# Patient Record
Sex: Male | Born: 1970
Health system: Southern US, Community
[De-identification: ages and names within clinical notes are randomized; demographics above are authoritative.]

## PROBLEM LIST (undated history)

## (undated) DIAGNOSIS — I219 Acute myocardial infarction, unspecified: Secondary | ICD-10-CM

## (undated) DIAGNOSIS — Z860100 Personal history of colon polyps, unspecified: Secondary | ICD-10-CM

## (undated) DIAGNOSIS — E785 Hyperlipidemia, unspecified: Secondary | ICD-10-CM

## (undated) DIAGNOSIS — G709 Myoneural disorder, unspecified: Secondary | ICD-10-CM

## (undated) HISTORY — DX: Myoneural disorder, unspecified: G70.9

## (undated) HISTORY — DX: Hyperlipidemia, unspecified: E78.5

## (undated) HISTORY — DX: Personal history of colon polyps, unspecified: Z86.0100

## (undated) HISTORY — DX: Acute myocardial infarction, unspecified: I21.9

## (undated) HISTORY — PX: COLONOSCOPY: SHX174

## (undated) HISTORY — DX: Personal history of colonic polyps: Z86.010

---

## 2011-05-13 DIAGNOSIS — E559 Vitamin D deficiency, unspecified: Secondary | ICD-10-CM | POA: Insufficient documentation

## 2014-05-05 HISTORY — PX: CARDIAC CATHETERIZATION: SHX172

## 2017-06-30 DIAGNOSIS — K635 Polyp of colon: Secondary | ICD-10-CM | POA: Diagnosis not present

## 2017-06-30 DIAGNOSIS — Z1211 Encounter for screening for malignant neoplasm of colon: Secondary | ICD-10-CM | POA: Diagnosis not present

## 2017-06-30 DIAGNOSIS — K648 Other hemorrhoids: Secondary | ICD-10-CM | POA: Diagnosis not present

## 2017-06-30 DIAGNOSIS — Z800 Family history of malignant neoplasm of digestive organs: Secondary | ICD-10-CM | POA: Diagnosis not present

## 2017-07-07 DIAGNOSIS — K635 Polyp of colon: Secondary | ICD-10-CM | POA: Diagnosis not present

## 2017-07-07 DIAGNOSIS — Z1211 Encounter for screening for malignant neoplasm of colon: Secondary | ICD-10-CM | POA: Diagnosis not present

## 2017-11-09 DIAGNOSIS — K219 Gastro-esophageal reflux disease without esophagitis: Secondary | ICD-10-CM | POA: Diagnosis not present

## 2017-11-09 DIAGNOSIS — R1011 Right upper quadrant pain: Secondary | ICD-10-CM | POA: Diagnosis not present

## 2017-11-09 DIAGNOSIS — R1013 Epigastric pain: Secondary | ICD-10-CM | POA: Diagnosis not present

## 2017-11-10 ENCOUNTER — Other Ambulatory Visit: Payer: Self-pay | Admitting: Gastroenterology

## 2017-11-10 ENCOUNTER — Other Ambulatory Visit: Payer: Self-pay | Admitting: Internal Medicine

## 2017-11-10 DIAGNOSIS — R748 Abnormal levels of other serum enzymes: Secondary | ICD-10-CM

## 2017-11-16 ENCOUNTER — Ambulatory Visit
Admission: RE | Admit: 2017-11-16 | Discharge: 2017-11-16 | Disposition: A | Discharge status: Home / Self Care | Payer: BLUE CROSS/BLUE SHIELD | Source: Ambulatory Visit | Attending: Gastroenterology | Admitting: Gastroenterology

## 2017-11-16 DIAGNOSIS — R748 Abnormal levels of other serum enzymes: Secondary | ICD-10-CM

## 2017-11-16 DIAGNOSIS — R109 Unspecified abdominal pain: Secondary | ICD-10-CM | POA: Diagnosis not present

## 2017-11-16 DIAGNOSIS — E119 Type 2 diabetes mellitus without complications: Secondary | ICD-10-CM | POA: Diagnosis not present

## 2017-11-16 MED ORDER — IOPAMIDOL (ISOVUE-300) INJECTION 61%
100.0000 mL | Freq: Once | INTRAVENOUS | Status: AC | PRN
  Administered 2017-11-16: 100 mL via INTRAVENOUS

## 2017-12-14 DIAGNOSIS — Z8679 Personal history of other diseases of the circulatory system: Secondary | ICD-10-CM | POA: Diagnosis not present

## 2017-12-14 DIAGNOSIS — R1013 Epigastric pain: Secondary | ICD-10-CM | POA: Diagnosis not present

## 2017-12-14 DIAGNOSIS — R748 Abnormal levels of other serum enzymes: Secondary | ICD-10-CM | POA: Diagnosis not present

## 2017-12-14 DIAGNOSIS — I2510 Atherosclerotic heart disease of native coronary artery without angina pectoris: Secondary | ICD-10-CM | POA: Diagnosis not present

## 2017-12-14 DIAGNOSIS — R932 Abnormal findings on diagnostic imaging of liver and biliary tract: Secondary | ICD-10-CM | POA: Diagnosis not present

## 2017-12-16 ENCOUNTER — Other Ambulatory Visit: Payer: Self-pay | Admitting: Gastroenterology

## 2017-12-16 DIAGNOSIS — R932 Abnormal findings on diagnostic imaging of liver and biliary tract: Secondary | ICD-10-CM

## 2017-12-16 DIAGNOSIS — R748 Abnormal levels of other serum enzymes: Secondary | ICD-10-CM

## 2017-12-25 ENCOUNTER — Other Ambulatory Visit: Payer: BLUE CROSS/BLUE SHIELD

## 2018-01-26 DIAGNOSIS — Z0000 Encounter for general adult medical examination without abnormal findings: Secondary | ICD-10-CM | POA: Diagnosis not present

## 2018-05-17 DIAGNOSIS — E785 Hyperlipidemia, unspecified: Secondary | ICD-10-CM | POA: Diagnosis not present

## 2018-05-17 DIAGNOSIS — M65341 Trigger finger, right ring finger: Secondary | ICD-10-CM | POA: Diagnosis not present

## 2018-05-17 DIAGNOSIS — E119 Type 2 diabetes mellitus without complications: Secondary | ICD-10-CM | POA: Diagnosis not present

## 2018-10-25 DIAGNOSIS — Z947 Corneal transplant status: Secondary | ICD-10-CM | POA: Diagnosis not present

## 2018-10-25 DIAGNOSIS — H18612 Keratoconus, stable, left eye: Secondary | ICD-10-CM | POA: Diagnosis not present

## 2018-10-25 DIAGNOSIS — H2513 Age-related nuclear cataract, bilateral: Secondary | ICD-10-CM | POA: Diagnosis not present

## 2018-10-25 DIAGNOSIS — E119 Type 2 diabetes mellitus without complications: Secondary | ICD-10-CM | POA: Diagnosis not present

## 2018-11-17 DIAGNOSIS — R972 Elevated prostate specific antigen [PSA]: Secondary | ICD-10-CM | POA: Diagnosis not present

## 2018-11-17 DIAGNOSIS — M65341 Trigger finger, right ring finger: Secondary | ICD-10-CM | POA: Diagnosis not present

## 2018-11-17 DIAGNOSIS — E119 Type 2 diabetes mellitus without complications: Secondary | ICD-10-CM | POA: Diagnosis not present

## 2018-11-30 DIAGNOSIS — E7849 Other hyperlipidemia: Secondary | ICD-10-CM | POA: Diagnosis not present

## 2018-11-30 DIAGNOSIS — I2510 Atherosclerotic heart disease of native coronary artery without angina pectoris: Secondary | ICD-10-CM | POA: Diagnosis not present

## 2018-11-30 DIAGNOSIS — Z7982 Long term (current) use of aspirin: Secondary | ICD-10-CM | POA: Diagnosis not present

## 2018-11-30 DIAGNOSIS — Z888 Allergy status to other drugs, medicaments and biological substances status: Secondary | ICD-10-CM | POA: Diagnosis not present

## 2018-12-02 DIAGNOSIS — Z888 Allergy status to other drugs, medicaments and biological substances status: Secondary | ICD-10-CM | POA: Diagnosis not present

## 2018-12-02 DIAGNOSIS — E7849 Other hyperlipidemia: Secondary | ICD-10-CM | POA: Diagnosis not present

## 2018-12-02 DIAGNOSIS — I2510 Atherosclerotic heart disease of native coronary artery without angina pectoris: Secondary | ICD-10-CM | POA: Diagnosis not present

## 2018-12-02 DIAGNOSIS — Z7982 Long term (current) use of aspirin: Secondary | ICD-10-CM | POA: Diagnosis not present

## 2018-12-06 ENCOUNTER — Encounter: Payer: Self-pay | Admitting: Family Medicine

## 2018-12-06 ENCOUNTER — Encounter: Payer: Self-pay | Admitting: Gastroenterology

## 2018-12-06 ENCOUNTER — Ambulatory Visit (INDEPENDENT_AMBULATORY_CARE_PROVIDER_SITE_OTHER): Payer: BLUE CROSS/BLUE SHIELD | Admitting: Family Medicine

## 2018-12-06 VITALS — BP 124/80 | HR 76 | Temp 98.2°F | Ht 64.0 in | Wt 165.0 lb

## 2018-12-06 DIAGNOSIS — F5231 Female orgasmic disorder: Secondary | ICD-10-CM | POA: Insufficient documentation

## 2018-12-06 DIAGNOSIS — M65341 Trigger finger, right ring finger: Secondary | ICD-10-CM

## 2018-12-06 DIAGNOSIS — Z125 Encounter for screening for malignant neoplasm of prostate: Secondary | ICD-10-CM | POA: Insufficient documentation

## 2018-12-06 DIAGNOSIS — K635 Polyp of colon: Secondary | ICD-10-CM

## 2018-12-06 DIAGNOSIS — E119 Type 2 diabetes mellitus without complications: Secondary | ICD-10-CM

## 2018-12-06 DIAGNOSIS — Z23 Encounter for immunization: Secondary | ICD-10-CM

## 2018-12-06 DIAGNOSIS — K760 Fatty (change of) liver, not elsewhere classified: Secondary | ICD-10-CM

## 2018-12-06 DIAGNOSIS — Z0000 Encounter for general adult medical examination without abnormal findings: Secondary | ICD-10-CM | POA: Diagnosis not present

## 2018-12-06 DIAGNOSIS — IMO0002 Reserved for concepts with insufficient information to code with codable children: Secondary | ICD-10-CM | POA: Insufficient documentation

## 2018-12-06 LAB — BASIC METABOLIC PANEL
BUN: 14 mg/dL (ref 6–23)
CHLORIDE: 104 meq/L (ref 96–112)
CO2: 24 mEq/L (ref 19–32)
CREATININE: 1.1 mg/dL (ref 0.40–1.50)
Calcium: 9 mg/dL (ref 8.4–10.5)
GFR: 71.14 mL/min (ref >60.00)
Glucose, Bld: 119 mg/dL — ABNORMAL HIGH (ref 70–99)
Potassium: 4.6 mEq/L (ref 3.5–5.1)
Sodium: 136 mEq/L (ref 135–145)

## 2018-12-06 NOTE — Patient Instructions (Signed)
Health Maintenance, Male A healthy lifestyle and preventive care is important for your health and wellness. Ask your health care provider about what schedule of regular examinations is right for you. What should I know about weight and diet? Eat a Healthy Diet  Eat plenty of vegetables, fruits, whole grains, low-fat dairy products, and lean protein.  Do not eat a lot of foods high in solid fats, added sugars, or salt.  Maintain a Healthy Weight Regular exercise can help you achieve or maintain a healthy weight. You should:  Do at least 150 minutes of exercise each week. The exercise should increase your heart rate and make you sweat (moderate-intensity exercise).  Do strength-training exercises at least twice a week. Watch Your Levels of Cholesterol and Blood Lipids  Have your blood tested for lipids and cholesterol every 5 years starting at 49 years of age. If you are at high risk for heart disease, you should start having your blood tested when you are 49 years old. You may need to have your cholesterol levels checked more often if: ? Your lipid or cholesterol levels are high. ? You are older than 50 years of age. ? You are at high risk for heart disease. What should I know about cancer screening? Many types of cancers can be detected early and may often be prevented. Lung Cancer  You should be screened every year for lung cancer if: ? You are a current smoker who has smoked for at least 30 years. ? You are a former smoker who has quit within the past 15 years.  Talk to your health care provider about your screening options, when you should start screening, and how often you should be screened. Colorectal Cancer  Routine colorectal cancer screening usually begins at 50 years of age and should be repeated every 5-10 years until you are 49 years old. You may need to be screened more often if early forms of precancerous polyps or small growths are found. Your health care provider may  recommend screening at an earlier age if you have risk factors for colon cancer.  Your health care provider may recommend using home test kits to check for hidden blood in the stool.  A small camera at the end of a tube can be used to examine your colon (sigmoidoscopy or colonoscopy). This checks for the earliest forms of colorectal cancer. Prostate and Testicular Cancer  Depending on your age and overall health, your health care provider may do certain tests to screen for prostate and testicular cancer.  Talk to your health care provider about any symptoms or concerns you have about testicular or prostate cancer. Skin Cancer  Check your skin from head to toe regularly.  Tell your health care provider about any new moles or changes in moles, especially if: ? There is a change in a mole's size, shape, or color. ? You have a mole that is larger than a pencil eraser.  Always use sunscreen. Apply sunscreen liberally and repeat throughout the day.  Protect yourself by wearing long sleeves, pants, a wide-brimmed hat, and sunglasses when outside. What should I know about heart disease, diabetes, and high blood pressure?  If you are 18-39 years of age, have your blood pressure checked every 3-5 years. If you are 40 years of age or older, have your blood pressure checked every year. You should have your blood pressure measured twice-once when you are at a hospital or clinic, and once when you are not at a hospital   or clinic. Record the average of the two measurements. To check your blood pressure when you are not at a hospital or clinic, you can use: ? An automated blood pressure machine at a pharmacy. ? A home blood pressure monitor.  Talk to your health care provider about your target blood pressure.  If you are between 86-4 years old, ask your health care provider if you should take aspirin to prevent heart disease.  Have regular diabetes screenings by checking your fasting blood sugar  level. ? If you are at a normal weight and have a low risk for diabetes, have this test once every three years after the age of 13. ? If you are overweight and have a high risk for diabetes, consider being tested at a younger age or more often.  A one-time screening for abdominal aortic aneurysm (AAA) by ultrasound is recommended for men aged 49-75 years who are current or former smokers. What should I know about preventing infection? Hepatitis B If you have a higher risk for hepatitis B, you should be screened for this virus. Talk with your health care provider to find out if you are at risk for hepatitis B infection. Hepatitis C Blood testing is recommended for:  Everyone born from [birth year] through 1966.  Anyone with known risk factors for hepatitis C. Sexually Transmitted Diseases (STDs)  You should be screened each year for STDs including gonorrhea and chlamydia if: ? You are sexually active and are younger than 49 years of age. ? You are older than 49 years of age and your health care provider tells you that you are at risk for this type of infection. ? Your sexual activity has changed since you were last screened and you are at an increased risk for chlamydia or gonorrhea. Ask your health care provider if you are at risk.  Talk with your health care provider about whether you are at high risk of being infected with HIV. Your health care provider may recommend a prescription medicine to help prevent HIV infection. What else can I do?  Schedule regular health, dental, and eye exams.  Stay current with your vaccines (immunizations).  Do not use any tobacco products, such as cigarettes, chewing tobacco, and e-cigarettes. If you need help quitting, ask your health care provider.  Limit alcohol intake to no more than 2 drinks per day. One drink equals 12 ounces of beer, 5 ounces of wine, or 1 ounces of hard liquor.  Do not use street drugs.  Do not share needles.  Ask your health  care provider for help if you need support or information about quitting drugs.  Tell your health care provider if you often feel depressed.  Tell your health care provider if you have ever been abused or do not feel safe at home. This information is not intended to replace advice given to you by your health care provider. Make sure you discuss any questions you have with your health care provider. Document Released: 04/03/2008 Document Revised: 06/04/2016 Document Reviewed: 07/10/2015 Elsevier Interactive Patient Education  2019 [employer] Nonalcoholic Fatty Liver Disease Diet Nonalcoholic fatty liver disease is a condition that causes fat to accumulate in and around the liver. The disease makes it harder for the liver to work the way that it should. Following a healthy diet can help to keep nonalcoholic fatty liver disease under control. It can also help to prevent or improve conditions that are associated with the disease, such as heart disease, diabetes, high blood  pressure, and abnormal cholesterol levels. Along with regular exercise, this diet:  Promotes weight loss.  Helps to control blood sugar levels.  Helps to improve the way that the body uses insulin. What do I need to know about this diet?  Use the glycemic index (GI) to plan your meals. The index tells you how quickly a food will raise your blood sugar. Choose low-GI foods. These foods take a longer time to raise blood sugar.  Keep track of how many calories you take in. Eating the right amount of calories will help you to achieve a healthy weight.  You may want to follow a Mediterranean diet. This diet includes a lot of vegetables, lean meats or fish, whole grains, fruits, and healthy oils and fats. What foods can I eat? Grains Whole grains, such as whole-wheat or whole-grain breads, crackers, tortillas, cereals, and pasta. Stone-ground whole wheat. Pumpernickel bread. Unsweetened oatmeal. Bulgur. Barley. Quinoa. Brown or  wild rice. Corn or whole-wheat flour tortillas. Vegetables Lettuce. Spinach. Peas. Beets. Cauliflower. Cabbage. Broccoli. Carrots. Tomatoes. Squash. Eggplant. Herbs. Peppers. Onions. Cucumbers. Brussels sprouts. Yams and sweet potatoes. Beans. Lentils. Fruits Bananas. Apples. Oranges. Grapes. Papaya. Mango. Pomegranate. Kiwi. Grapefruit. Cherries. Meats and Other Protein Sources Seafood and shellfish. Lean meats. Poultry. Tofu. Dairy Low-fat or fat-free dairy products, such as yogurt, cottage cheese, and cheese. Beverages Water. Sugar-free drinks. Tea. Coffee. Low-fat or skim milk. Milk alternatives, such as soy or almond milk. Real fruit juice. Condiments Mustard. Relish. Low-fat, low-sugar ketchup and barbecue sauce. Low-fat or fat-free mayonnaise. Sweets and Desserts Sugar-free sweets. Fats and Oils Avocado. Canola or olive oil. Nuts and nut butters. Seeds. The items listed above may not be a complete list of recommended foods or beverages. Contact your dietitian for more options. What foods are not recommended? Palm oil and coconut oil. Processed foods. Fried foods. Sweetened drinks, such as sweet tea, milkshakes, snow cones, iced sweet drinks, and sodas. Alcohol. Sweets. Foods that contain a lot of salt or sodium. The items listed above may not be a complete list of foods and beverages to avoid. Contact your dietitian for more information. This information is not intended to replace advice given to you by your health care provider. Make sure you discuss any questions you have with your health care provider. Document Released: 02/20/2015 Document Revised: 03/13/2016 Document Reviewed: 10/31/2014 Elsevier Interactive Patient Education  2019 [employer].

## 2018-12-06 NOTE — Progress Notes (Signed)
Established Patient Office Visit  Subjective:  Patient ID: Joseph Horton, male    DOB: 1971/05/04  Age: 48 y.o. MRN: [id number]  CC:  Chief Complaint  Patient presents with  . Establish Care    HPI Deontre Allsup presents for establishment of care for complete physical exam and follow-up of multiple medical issues.  Significant past medical history of inferior MI back in 2015.  Status post placement of 2 stents.  He is currently followed by cardiology and has been taking a PC KS 9 inhibitor.  History of diabetes.  Lab work drawn last month shows a hemoglobin A1c of 6.4.  He did see the eye doctor in January.  History of colonic polyposis was advised to follow-up for a colonoscopy this year.  History of fatty liver disease.  Check triglycerides drawn this past month were 205.  He has 1 glass of red wine nightly.  On occasion he has 2 beers in a given setting.  He is not currently exercising.  LDL from cardiology was 84 this past month.  Patient reports a decreased sensation with orgasm.  He has no difficulty with erection dysfunction.  History reviewed. No pertinent past medical history.  History reviewed. No pertinent surgical history.  History reviewed. No pertinent family history.  Social History   Socioeconomic History  . Marital status: Married    Spouse name: Not on file  . Number of children: Not on file  . Years of education: Not on file  . Highest education level: Not on file  Occupational History  . Not on file  Social Needs  . Financial resource strain: Not on file  . Food insecurity:    Worry: Not on file    Inability: Not on file  . Transportation needs:    Medical: Not on file    Non-medical: Not on file  Tobacco Use  . Smoking status: Never Smoker  . Smokeless tobacco: Never Used  Substance and Sexual Activity  . Alcohol use: Not on file  . Drug use: Not on file  . Sexual activity: Not on file  Lifestyle  . Physical activity:    Days per week: Not on file      Minutes per session: Not on file  . Stress: Not on file  Relationships  . Social connections:    Talks on phone: Not on file    Gets together: Not on file    Attends religious service: Not on file    Active member of club or organization: Not on file    Attends meetings of clubs or organizations: Not on file    Relationship status: Not on file  . Intimate partner violence:    Fear of current or ex partner: Not on file    Emotionally abused: Not on file    Physically abused: Not on file    Forced sexual activity: Not on file  Other Topics Concern  . Not on file  Social History Narrative  . Not on file    Outpatient Medications Prior to Visit  Medication Sig Dispense Refill  . Alirocumab (PRALUENT) 75 MG/ML SOAJ Inject 1 mL (75 mg total) into the skin every 14 days.    Marland Kitchen aspirin 81 MG tablet Take 1 tablet by mouth daily.    Marland Kitchen JANUMET XR 50-1000 MG TB24 Take 1 tablet by mouth daily.    Marland Kitchen lisinopril (PRINIVIL,ZESTRIL) 10 MG tablet Take 10 mg by mouth daily.    . metoprolol succinate (TOPROL-XL) 25 MG  24 hr tablet Take 1 tablet by mouth daily.    . nitroGLYCERIN (NITROSTAT) 0.4 MG SL tablet Place 1 tablet under the tongue every 5 minutes as needed for up to 30 days for chest pain.    . Omega-3 1000 MG CAPS Take 1 capsule by mouth daily.    . ranitidine (ZANTAC) 150 MG tablet Take 1 tablet by mouth daily.    . Alirocumab (PRALUENT) 150 MG/ML SOSY Inject into the skin.     No facility-administered medications prior to visit.     Allergies  Allergen Reactions  . Rosuvastatin Other (See Comments)    myalgia myalgia   . Statins Other (See Comments)    ROS Review of Systems  Constitutional: Negative for chills, fatigue, fever and unexpected weight change.  HENT: Negative.   Eyes: Negative for photophobia and visual disturbance.  Respiratory: Negative.   Cardiovascular: Negative.   Gastrointestinal: Negative.   Endocrine: Negative for polyphagia and polyuria.   Genitourinary: Negative for difficulty urinating, frequency and urgency.  Musculoskeletal: Negative for gait problem and joint swelling.  Skin: Negative for pallor and rash.  Allergic/Immunologic: Negative for immunocompromised state.  Neurological: Negative for light-headedness and headaches.  Hematological: Does not bruise/bleed easily.  Psychiatric/Behavioral: Negative.       Objective:    Physical Exam  Constitutional: He is oriented to person, place, and time. He appears well-developed and well-nourished. No distress.  HENT:  Head: Normocephalic and atraumatic.  Right Ear: External ear normal.  Left Ear: External ear normal.  Mouth/Throat: Oropharynx is clear and moist. No oropharyngeal exudate.  Eyes: Pupils are equal, round, and reactive to light. Conjunctivae are normal. Right eye exhibits no discharge. Left eye exhibits no discharge. No scleral icterus.  Neck: Neck supple. No JVD present. No tracheal deviation present. No thyromegaly present.  Cardiovascular: Normal rate, regular rhythm and normal heart sounds.  Pulmonary/Chest: Effort normal and breath sounds normal. No stridor.  Abdominal: Bowel sounds are normal. He exhibits no distension. There is no abdominal tenderness. There is no rebound and no guarding.  Genitourinary: Rectum:     Guaiac result negative.     No rectal mass, anal fissure, tenderness, external hemorrhoid, internal hemorrhoid or abnormal anal tone.  Prostate is not enlarged and not tender.  Musculoskeletal:       Arms:  Neurological: He is alert and oriented to person, place, and time.  Skin: Skin is warm and dry. He is not diaphoretic.  Psychiatric: He has a normal mood and affect. His behavior is normal.    BP 124/80   Pulse 76   Temp 98.2 F (36.8 C) (Oral)   Ht '5\' 4"'  (1.626 m)   Wt 165 lb (74.8 kg)   SpO2 98%   BMI 28.32 kg/m  Wt Readings from Last 3 Encounters:  12/06/18 165 lb (74.8 kg)   BP Readings from Last 3 Encounters:   12/06/18 124/80   Guideline developer:  UpToDate (see UpToDate for funding source) Date Released: June 2014  Health Maintenance Due  Topic Date Due  . HEMOGLOBIN A1C  06/05/1970  . FOOT EXAM  12/01/1979  . OPHTHALMOLOGY EXAM  12/01/1979  . HIV Screening  11/30/1984  . TETANUS/TDAP  11/30/1988    There are no preventive care reminders to display for this patient.  No results found for: TSH No results found for: WBC, HGB, HCT, MCV, PLT No results found for: NA, K, CHLORIDE, CO2, GLUCOSE, BUN, CREATININE, BILITOT, ALKPHOS, AST, ALT, PROT, ALBUMIN, CALCIUM, ANIONGAP, EGFR,  GFR No results found for: CHOL No results found for: HDL No results found for: LDLCALC No results found for: TRIG No results found for: CHOLHDL No results found for: HGBA1C    Assessment & Plan:   Problem List Items Addressed This Visit      Digestive   Fatty liver disease, nonalcoholic   Relevant Orders   Ambulatory referral to Gastroenterology   Polyp of colon   Relevant Orders   Ambulatory referral to Gastroenterology     Endocrine   Type 2 diabetes mellitus without complication, without long-term current use of insulin (HCC)   Relevant Medications   JANUMET XR 50-1000 MG TB24   lisinopril (PRINIVIL,ZESTRIL) 10 MG tablet   aspirin 81 MG tablet   Other Relevant Orders   Basic metabolic panel     Musculoskeletal and Integument   Trigger ring finger of right hand   Relevant Orders   Ambulatory referral to Sports Medicine     Other   Need for immunization against influenza   Relevant Orders   Flu Vaccine QUAD 36+ mos IM (Completed)   Healthcare maintenance - Primary   Orgasm dysfunction   Relevant Orders   Ambulatory referral to Urology      No orders of the defined types were placed in this encounter.   Follow-up: Return in about 3 months (around 03/06/2019).    Encourage patient to exercise and lose weight and maintain control of his diabetes is the main treatment for fatty liver  disease.  He will follow-up with gastroenterology for repeat colonoscopy and fatty liver disease.

## 2018-12-09 ENCOUNTER — Ambulatory Visit (INDEPENDENT_AMBULATORY_CARE_PROVIDER_SITE_OTHER): Payer: BLUE CROSS/BLUE SHIELD

## 2018-12-09 ENCOUNTER — Ambulatory Visit: Payer: BLUE CROSS/BLUE SHIELD | Admitting: Family Medicine

## 2018-12-09 ENCOUNTER — Encounter: Payer: Self-pay | Admitting: Family Medicine

## 2018-12-09 VITALS — BP 120/84 | HR 84 | Temp 98.6°F | Ht 64.0 in | Wt 165.0 lb

## 2018-12-09 DIAGNOSIS — M65341 Trigger finger, right ring finger: Secondary | ICD-10-CM | POA: Diagnosis not present

## 2018-12-09 NOTE — Assessment & Plan Note (Signed)
Acute on chronic and has been worsening.  Interferes with some of his hobbies. -Trigger finger injection -Counseled on buddy taping. -Counseled on supportive care. -Given indications to return

## 2018-12-09 NOTE — Patient Instructions (Signed)
Nice to meet you  Please try to keep the fingers buddy taped together at night  Please try to avoid golf until next weekend  Please see me back in 4-6 weeks if the triggering returns.

## 2018-12-09 NOTE — Progress Notes (Signed)
Tregan Read - 48 y.o. male MRN [id number]  Date of birth: Mar 04, 1971  SUBJECTIVE:  Including CC & ROS.  Chief Complaint  Patient presents with  . Hand Pain    pt here for right ring finger pain. pt sts this has been ongoing for almost a year.     Albie Arizpe is a 48 y.o. male that is presenting with triggering of the right ring finger.  This is been occurring for 8 months.  Denies any inciting event.  Feels like the pain is getting worse.  He has significant pain in the mornings or when he is gripping something.  Cannot play golf without pain.  Has tried oral medications with no relief.  No history of prior symptoms.  Localized to the right ring finger.  Symptoms are intermittent and moderate to severe.  No swelling or ecchymosis..    Review of Systems  Constitutional: Negative for fever.  HENT: Negative for congestion.   Respiratory: Negative for cough.   Cardiovascular: Negative for chest pain.  Gastrointestinal: Negative for abdominal pain.  Musculoskeletal: Negative for back pain.  Skin: Negative for color change.  Neurological: Negative for weakness.  Hematological: Negative for adenopathy.  Psychiatric/Behavioral: Negative for agitation.    HISTORY: Past Medical, Surgical, Social, and Family History Reviewed & Updated per EMR.   Pertinent Historical Findings include:  No past medical history on file.  No past surgical history on file.  Allergies  Allergen Reactions  . Rosuvastatin Other (See Comments)    myalgia myalgia   . Statins Other (See Comments)    No family history on file.   Social History   Socioeconomic History  . Marital status: Significant Other    Spouse name: Not on file  . Number of children: Not on file  . Years of education: Not on file  . Highest education level: Not on file  Occupational History  . Not on file  Social Needs  . Financial resource strain: Not on file  . Food insecurity:    Worry: Not on file    Inability: Not on  file  . Transportation needs:    Medical: Not on file    Non-medical: Not on file  Tobacco Use  . Smoking status: Never Smoker  . Smokeless tobacco: Never Used  Substance and Sexual Activity  . Alcohol use: Not on file  . Drug use: Not on file  . Sexual activity: Not on file  Lifestyle  . Physical activity:    Days per week: Not on file    Minutes per session: Not on file  . Stress: Not on file  Relationships  . Social connections:    Talks on phone: Not on file    Gets together: Not on file    Attends religious service: Not on file    Active member of club or organization: Not on file    Attends meetings of clubs or organizations: Not on file    Relationship status: Not on file  . Intimate partner violence:    Fear of current or ex partner: Not on file    Emotionally abused: Not on file    Physically abused: Not on file    Forced sexual activity: Not on file  Other Topics Concern  . Not on file  Social History Narrative  . Not on file     PHYSICAL EXAM:  VS: BP 120/84   Pulse 84   Temp 98.6 F (37 C) (Oral)   Ht 5\' 4"  (  1.626 m)   SpO2 97%   BMI 28.32 kg/m  Physical Exam Gen: NAD, alert, cooperative with exam, well-appearing ENT: normal lips, normal nasal mucosa,  Eye: normal EOM, normal conjunctiva and lids CV:  no edema, +2 pedal pulses   Resp: no accessory muscle use, non-labored,  Skin: no rashes, no areas of induration  Neuro: normal tone, normal sensation to touch Psych:  normal insight, alert and oriented MSK:  Right hand: Tenderness over the left flexor of the ring finger. Triggering evident when gripping. Normal grip strength. No swelling. Normal range of motion. Neurovascular intact   Aspiration/Injection Procedure Note Meredith Mells Nov 09, 1970  Procedure: Injection Indications: Right trigger finger of ring finger  Procedure Details Consent: Risks of procedure as well as the alternatives and risks of each were explained to the  (patient/caregiver).  Consent for procedure obtained. Time Out: Verified patient identification, verified procedure, site/side was marked, verified correct patient position, special equipment/implants available, medications/allergies/relevent history reviewed, required imaging and test results available.  Performed.  The area was cleaned with iodine and alcohol swabs.    The right ring finger trigger finger was injected using 0.5 cc's of 40 mg Kenalog and 0.5 cc's of 0.25% bupivacaine with a 25 1 1/2" needle.  Ultrasound was used. Images were obtained in long views showing the injection.     A sterile dressing was applied.  Patient did tolerate procedure well.       ASSESSMENT & PLAN:   Trigger ring finger of right hand Acute on chronic and has been worsening.  Interferes with some of his hobbies. -Trigger finger injection -Counseled on buddy taping. -Counseled on supportive care. -Given indications to return

## 2019-01-13 ENCOUNTER — Telehealth (INDEPENDENT_AMBULATORY_CARE_PROVIDER_SITE_OTHER): Payer: BLUE CROSS/BLUE SHIELD | Admitting: Gastroenterology

## 2019-01-13 ENCOUNTER — Other Ambulatory Visit: Payer: Self-pay

## 2019-01-13 DIAGNOSIS — Z86010 Personal history of colonic polyps: Secondary | ICD-10-CM

## 2019-01-13 DIAGNOSIS — R7401 Elevation of levels of liver transaminase levels: Secondary | ICD-10-CM

## 2019-01-13 DIAGNOSIS — K760 Fatty (change of) liver, not elsewhere classified: Secondary | ICD-10-CM | POA: Diagnosis not present

## 2019-01-13 DIAGNOSIS — R740 Nonspecific elevation of levels of transaminase and lactic acid dehydrogenase [LDH]: Secondary | ICD-10-CM | POA: Diagnosis not present

## 2019-01-13 DIAGNOSIS — R932 Abnormal findings on diagnostic imaging of liver and biliary tract: Secondary | ICD-10-CM

## 2019-01-13 NOTE — Progress Notes (Signed)
Chief Complaint: Elevated ALT, fatty liver, abnormal CT, history of colon polyps   Referring Provider:     Libby Maw, MD   HPI:    Due to current restrictions/limitations of in-office visits due to the COVID-19 pandemic, this scheduled clinical appointment was converted to a telehealth virtual consultation.  -Time of medical discussion: 23 minutes -The patient did consent to this virtual visit and is aware of possible charges through their insurance for this visit.  -Names of all parties present: Joseph Horton (patient), Joseph Heck, DO, [facility] (physician) -Patient location: Home -Physician location: Office  Joseph Horton is a 48 y.o. male with a hx of CAD w/ MI in 2015 s/p PCI w/ 2 stents placed in right coronary artery, DM (A1c 6.4), HLD and hx of colon polyps referred to the [facility] for evaluation of fatty liver disease and ongoing polyp surveillance.  Last colonoscopy was in 2018 with [facility] which was notable for polyps, with recommendation to repeat in 2 years (unclear reason; requesting records today).  Otherwise, he is without lower GI symptoms to include no hematochezia, change in bowel habits, abdominal pain.  Tolerating all p.o. intake.  Weight is stable.  Unclear why he had early CRC screening, but he cites some issue with his mother, but not colon cancer or colon polyps.  Additionally, he states he has a history of fatty liver disease.  Was previously evaluated by [facility] for this issue in 2019 to include a CT as outlined below.  Very mildly elevated liver enzymes in 2018.  He states these were repeated last year and improved.  Requesting those records today. Was previously drinking approx 10 drinks/week (wine/beer), but stopped drinking since being told about the liver issues.  BMI currently 29. Started exercising 2 weeks ago and started new, healthy diet per Dr. Bebe Shaggy recommendations.   No recent liver enzymes for review.  Most  recent study was from 04/2017 with AST/ALT 33/45, ALP 79, T bili 0.8 with normal albumin.  Normal BMP 11/2018.  No recent CBC.  CT abdomen (10/2017) with ?fine liver surface irregularity and slight hypertrophy of lateral left lobe, unable to exclude early cirrhosis.  0.5 cm hypodensity in the inferior right liver lobe.  Normal ducts, normal pancreas, spleen.  Radiologist recommended hepatic elastography along with MRI three-phase in 3 to 6 months for the small hypodensity.  Interestingly, he even sought a 2nd opinion with another GI in [place] (where he is from) in 2019, to include MRI liver, which she reports looked normal.  This report was sent to his physician at [place].  Otherwise, no known personal or family history of liver or pancreatic disease.  He denies any history of ascites, jaundice, encephalopathy, GI bleed.  No previous liver biopsy.  Unclear what additional serologic evaluation has been done in the past.  Past medical history, past surgical history, social history, family history, medications, and allergies reviewed in the chart and with patient over the WebEx.   PMHx: CAD with MI in 2015, PCI with 2 stents placed to right coronary artery-2015 Diabetes Hyperlipidemia Fatty liver disease History of colon polyps  PSHx: Cardiac catheterization Coronary angioplasty  FHx: No known family history of CRC, GI malignancy, liver disease, pancreatic disease, or IBD.   Social History   Tobacco Use  . Smoking status: Never Smoker  . Smokeless tobacco: Never Used  Substance Use Topics  . Alcohol use: Not on  file  . Drug use: Not on file   Current Outpatient Medications  Medication Sig Dispense Refill  . Alirocumab (PRALUENT) 75 MG/ML SOAJ Inject 1 mL (75 mg total) into the skin every 14 days.    Marland Kitchen aspirin 81 MG tablet Take 1 tablet by mouth daily.    Marland Kitchen JANUMET XR 50-1000 MG TB24 Take 1 tablet by mouth daily.    Marland Kitchen lisinopril (PRINIVIL,ZESTRIL) 10 MG tablet Take 10 mg by mouth daily.     . metoprolol succinate (TOPROL-XL) 25 MG 24 hr tablet Take 1 tablet by mouth daily.    . nitroGLYCERIN (NITROSTAT) 0.4 MG SL tablet Place 1 tablet under the tongue every 5 minutes as needed for up to 30 days for chest pain.    . Omega-3 1000 MG CAPS Take 1 capsule by mouth daily.    . ranitidine (ZANTAC) 150 MG tablet Take 1 tablet by mouth daily.     No current facility-administered medications for this visit.    Allergies  Allergen Reactions  . Rosuvastatin Other (See Comments)    myalgia myalgia   . Statins Other (See Comments)     Review of Systems: All systems reviewed and negative except where noted in HPI.     Physical Exam:    Physical exam not completed due to the nature of this telehealth communication.  Patient was otherwise alert and oriented and well communicative.   ASSESSMENT AND PLAN;   1) Elevated ALT: Labs from 2018 with very mildly elevated ALT>AST.  CT from 2019 with possible early cirrhotic changes. Given history of metabolic syndrome comorbidities (CAD, HLD, DM), favor NAFLD as the underlying etiology for elevated LAEs. However, given age, body habitus (BMI 29) with, CT findings, certainly warranted further serologic and radiographic evaluation for underlying concomitant liver diseases.    -We will obtain copy of previous MRI report, notes from previous GI, and evaluation if any extended serologic evaluation has been completed, so not to duplicate these efforts. -Depending on what has been done and the results of those studies, may check INR and CBC now to evaluate synthetic function and if considering biopsy in the future - Repeat liver enzymes in 6 months to evaluate for effectiveness of diet/exercise modifications -If not already done, and depending on previous MRI results and response to diet/exercise modifications, can then consider extended serologic evaluation in approximately 6 months if indicated  - Holding off on MRI elastography for now pending  review of prior records -Obtain records from [facility] - Given the presumed diagnosis of fatty liver disease, I reviewed the importance of diet and exercise, with a modest weight loss of 10% of total body weight, done slowly over weeks to months, which he is already implementing - To follow-up with me in 3-6 months or sooner prn   2) Hepatic hypodensity: 0.5 cm hypodensity noted in the right lobe of the liver on CT in 2019. - MRI three-phase liver apparently already done by patient. -Obtain previous report  3) History of colon polyps: - Colonoscopy 2018 reportedly notable for polyps recommendation repeat in 2 years. -Obtain copy of previous colonoscopy report - Plan for colonoscopy pending review of records   Explained that all elective endoscopic and radiographic procedures/studies are currently on hold due to restrictions related to the COVID-19 pandemic which he clearly understood.  Lavena Bullion, DO, FACG  01/13/2019, 8:42 AM   Ethelene Hal Mortimer Fries,*

## 2019-01-13 NOTE — Patient Instructions (Signed)
To help prevent the possible spread of infection to our patients, communities, and staff; we will be implementing the following measures:  As of now we are not allowing any visitors/family members to accompany you to any upcoming appointments with [facility]. If you have any concerns about this please contact our office to discuss prior to the appointment.   Please call our office at [phone] to set up your 3-6 month follow up visit.  It was a pleasure to see you today!  Vito Cirigliano, D.O.

## 2019-02-04 DIAGNOSIS — Z7982 Long term (current) use of aspirin: Secondary | ICD-10-CM | POA: Diagnosis not present

## 2019-02-04 DIAGNOSIS — E7849 Other hyperlipidemia: Secondary | ICD-10-CM | POA: Diagnosis not present

## 2019-02-04 DIAGNOSIS — Z888 Allergy status to other drugs, medicaments and biological substances status: Secondary | ICD-10-CM | POA: Diagnosis not present

## 2019-02-04 DIAGNOSIS — I2510 Atherosclerotic heart disease of native coronary artery without angina pectoris: Secondary | ICD-10-CM | POA: Diagnosis not present

## 2019-02-08 DIAGNOSIS — N528 Other male erectile dysfunction: Secondary | ICD-10-CM | POA: Diagnosis not present

## 2019-02-08 DIAGNOSIS — E349 Endocrine disorder, unspecified: Secondary | ICD-10-CM | POA: Diagnosis not present

## 2019-03-04 ENCOUNTER — Encounter: Payer: Self-pay | Admitting: Family Medicine

## 2019-03-08 ENCOUNTER — Encounter: Payer: Self-pay | Admitting: Family Medicine

## 2019-03-08 ENCOUNTER — Ambulatory Visit (INDEPENDENT_AMBULATORY_CARE_PROVIDER_SITE_OTHER): Payer: BLUE CROSS/BLUE SHIELD | Admitting: Family Medicine

## 2019-03-08 VITALS — Ht 64.0 in

## 2019-03-08 DIAGNOSIS — Z0000 Encounter for general adult medical examination without abnormal findings: Secondary | ICD-10-CM

## 2019-03-08 DIAGNOSIS — E119 Type 2 diabetes mellitus without complications: Secondary | ICD-10-CM | POA: Diagnosis not present

## 2019-03-08 MED ORDER — JANUMET XR 50-1000 MG PO TB24: 1.0000 | ORAL_TABLET | Freq: Every day | ORAL | 1 refills | Status: DC

## 2019-03-08 NOTE — Progress Notes (Signed)
Virtual Visit via Video Note  I connected with Joseph Horton on 03/08/19 at  8:00 AM EDT by a video enabled telemedicine application and verified that I am speaking with the correct person using two identifiers.  Location: Patient: home Provider:    Established Patient Office Visit  Subjective:  Patient ID: Joseph Horton, male    DOB: 06-Jan-1971  Age: 48 y.o. MRN: [id number]  CC: No chief complaint on file.   HPI Joseph Horton presents for follow-up of his diabetes.  Fasting sugars have been running in the less than 110 range.  He did have an eye exam back in January.  He continues to take his Janumet on a daily basis without issue.  His father is 95 and has a history of heart disease and prostate issues.  Mom is 43 and has a history of asthma and heart disease.  Patient with history of coronary artery disease status post stent placement.  He remains on a PC [place] 9 inhibitor.  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  Would like to know to work from home exclusively. History reviewed. No pertinent past medical history.  History reviewed. No pertinent surgical history.  Family History  Problem Relation Age of Onset  . Asthma Mother   . Hyperlipidemia Mother   . Heart disease Father     Social History   Socioeconomic History  . Marital status: Significant Other    Spouse name: Not on file  . Number of children: Not on file  . Years of education: Not on file  . Highest education level: Not on file  Occupational History  . Not on file  Social Needs  . Financial resource strain: Not on file  . Food insecurity:    Worry: Not on file    Inability: Not on file  . Transportation needs:    Medical: Not on file    Non-medical: Not on file  Tobacco Use  . Smoking status: Never Smoker  . Smokeless tobacco: Never Used  Substance and Sexual Activity  . Alcohol use: Yes    Comment: 2 glasses of wine per week.  . Drug use: Never  . Sexual activity: Not on file   Lifestyle  . Physical activity:    Days per week: Not on file    Minutes per session: Not on file  . Stress: Not on file  Relationships  . Social connections:    Talks on phone: Not on file    Gets together: Not on file    Attends religious service: Not on file    Active member of club or organization: Not on file    Attends meetings of clubs or organizations: Not on file    Relationship status: Not on file  . Intimate partner violence:    Fear of current or ex partner: Not on file    Emotionally abused: Not on file    Physically abused: Not on file    Forced sexual activity: Not on file  Other Topics Concern  . Not on file  Social History Narrative  . Not on file    Outpatient Medications Prior to Visit  Medication Sig Dispense Refill  . Alirocumab (PRALUENT) 75 MG/ML SOAJ Inject 1 mL (75 mg total) into the skin every 14 days.    Marland Kitchen aspirin 81 MG tablet Take 1 tablet by mouth daily.    Marland Kitchen lisinopril (PRINIVIL,ZESTRIL) 10 MG tablet Take 10 mg by mouth daily.    Marland Kitchen  metoprolol succinate (TOPROL-XL) 25 MG 24 hr tablet Take 1 tablet by mouth daily.    . nitroGLYCERIN (NITROSTAT) 0.4 MG SL tablet Place 1 tablet under the tongue every 5 minutes as needed for up to 30 days for chest pain.    . Omega-3 1000 MG CAPS Take 1 capsule by mouth daily.    . ranitidine (ZANTAC) 150 MG tablet Take 1 tablet by mouth daily.    Marland Kitchen JANUMET XR 50-1000 MG TB24 Take 1 tablet by mouth daily.     No facility-administered medications prior to visit.     Allergies  Allergen Reactions  . Rosuvastatin Other (See Comments)    myalgia myalgia   . Statins Other (See Comments)    ROS Review of Systems  Constitutional: Negative for diaphoresis, fatigue, fever and unexpected weight change.  Eyes: Negative for photophobia and visual disturbance.  Respiratory: Negative.   Cardiovascular: Negative.   Gastrointestinal: Negative.   Endocrine: Negative for polyphagia and polyuria.  Genitourinary:  Negative.  Negative for decreased urine volume, difficulty urinating and frequency.  Musculoskeletal: Negative for arthralgias and myalgias.  Neurological: Negative for headaches.  Hematological: Does not bruise/bleed easily.  Psychiatric/Behavioral: Negative.       Objective:    Physical Exam  Constitutional: He is oriented to person, place, and time. He appears well-developed and well-nourished. No distress.  HENT:  Head: Normocephalic and atraumatic.  Right Ear: External ear normal.  Left Ear: External ear normal.  Eyes: Conjunctivae are normal. Right eye exhibits no discharge. Left eye exhibits no discharge. No scleral icterus.  Neck: No JVD present. No tracheal deviation present.  Pulmonary/Chest: Effort normal. No stridor.  Neurological: He is alert and oriented to person, place, and time.  Skin: He is not diaphoretic.  Psychiatric: He has a normal mood and affect. His behavior is normal.    Ht 5\' 4"  (1.626 m)   BMI 28.32 kg/m  Wt Readings from Last 3 Encounters:  12/09/18 165 lb (74.8 kg)  12/06/18 165 lb (74.8 kg)     Health Maintenance Due  Topic Date Due  . HEMOGLOBIN A1C  29-Oct-1969  . FOOT EXAM  12/01/1979  . HIV Screening  11/30/1984  . TETANUS/TDAP  11/30/1988    There are no preventive care reminders to display for this patient.  No results found for: TSH No results found for: WBC, HGB, HCT, MCV, PLT Lab Results  Component Value Date   NA 136 12/06/2018   K 4.6 12/06/2018   CO2 24 12/06/2018   GLUCOSE 119 (H) 12/06/2018   BUN 14 12/06/2018   CREATININE 1.10 12/06/2018   CALCIUM 9.0 12/06/2018   GFR 71.14 12/06/2018   No results found for: CHOL No results found for: HDL No results found for: LDLCALC No results found for: TRIG No results found for: CHOLHDL No results found for: HGBA1C    Assessment & Plan:   Problem List Items Addressed This Visit      Endocrine   Type 2 diabetes mellitus without complication, without long-term current  use of insulin (HCC) - Primary   Relevant Medications   JANUMET XR 50-1000 MG TB24   Other Relevant Orders   Comprehensive metabolic panel   CBC   Hemoglobin A1c   Urinalysis, Routine w reflex microscopic   Microalbumin / creatinine urine ratio     Other   Healthcare maintenance   Relevant Orders   PSA      Meds ordered this encounter  Medications  . JANUMET XR  50-1000 MG TB24    Sig: Take 1 tablet by mouth daily.    Dispense:  90 tablet    Refill:  1    Follow-up: Return in about 6 months (around 09/08/2019), or if symptoms worsen or fail to improve.    Libby Maw, MD   I discussed the limitations of evaluation and management by telemedicine and the availability of in person appointments. The patient expressed understanding and agreed to proceed.  History of Present Illness:    Observations/Objective:   Assessment and Plan:   Follow Up Instructions:    I discussed the assessment and treatment plan with the patient. The patient was provided an opportunity to ask questions and all were answered. The patient agreed with the plan and demonstrated an understanding of the instructions.   The patient was advised to call back or seek an in-person evaluation if the symptoms worsen or if the condition fails to improve as anticipated.  I provided 25 minutes of non-face-to-face time during this encounter.

## 2019-03-25 ENCOUNTER — Encounter: Payer: Self-pay | Admitting: Family Medicine

## 2019-04-15 ENCOUNTER — Encounter: Payer: Self-pay | Admitting: Family Medicine

## 2019-04-27 ENCOUNTER — Encounter: Payer: Self-pay | Admitting: Family Medicine

## 2019-04-28 ENCOUNTER — Telehealth: Payer: Self-pay | Admitting: *Deleted

## 2019-04-28 ENCOUNTER — Encounter: Payer: Self-pay | Admitting: Family Medicine

## 2019-04-28 ENCOUNTER — Telehealth (INDEPENDENT_AMBULATORY_CARE_PROVIDER_SITE_OTHER): Payer: BC Managed Care – PPO | Admitting: Family Medicine

## 2019-04-28 ENCOUNTER — Ambulatory Visit: Payer: Self-pay | Admitting: *Deleted

## 2019-04-28 VITALS — Ht 64.0 in

## 2019-04-28 DIAGNOSIS — Z7189 Other specified counseling: Secondary | ICD-10-CM | POA: Diagnosis not present

## 2019-04-28 DIAGNOSIS — Z20828 Contact with and (suspected) exposure to other viral communicable diseases: Secondary | ICD-10-CM | POA: Diagnosis not present

## 2019-04-28 DIAGNOSIS — Z20822 Contact with and (suspected) exposure to covid-19: Secondary | ICD-10-CM

## 2019-04-28 NOTE — Telephone Encounter (Signed)
Noted. Pt has appt with pcp today.

## 2019-04-28 NOTE — Telephone Encounter (Signed)
Message Received: Today Message Contents  Rodrigo Ran, CMA  [facility] Testing Pool        Pt's co-worker tested positive, per pcp, pt needs to be tested   Testing referred by Dr. Ethelene Hal.   Pt called and scheduled for testing at [facility] location on 04/29/19. Pt advised to wear a mask ant to remain in car at the time of scheduled appt. Understanding verbalized.

## 2019-04-28 NOTE — Progress Notes (Addendum)
Established Patient Office Visit  Subjective:  Patient ID: Joseph Horton, male    DOB: 07-Oct-1971  Age: 48 y.o. MRN: [id number]  CC:  Chief Complaint  Patient presents with  . exposure to covid    HPI Joseph Horton presents for evaluation status post exposure to COVID 3 days ago.  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  Patient is without symptoms.  Denies fevers chills headache cough shortness of breath body aches or changes in his taste or smell.  History reviewed. No pertinent past medical history.  History reviewed. No pertinent surgical history.  Family History  Problem Relation Age of Onset  . Asthma Mother   . Hyperlipidemia Mother   . Heart disease Father     Social History   Socioeconomic History  . Marital status: Significant Other    Spouse name: Not on file  . Number of children: Not on file  . Years of education: Not on file  . Highest education level: Not on file  Occupational History  . Not on file  Social Needs  . Financial resource strain: Not on file  . Food insecurity    Worry: Not on file    Inability: Not on file  . Transportation needs    Medical: Not on file    Non-medical: Not on file  Tobacco Use  . Smoking status: Never Smoker  . Smokeless tobacco: Never Used  Substance and Sexual Activity  . Alcohol use: Yes    Comment: 2 glasses of wine per week.  . Drug use: Never  . Sexual activity: Not on file  Lifestyle  . Physical activity    Days per week: Not on file    Minutes per session: Not on file  . Stress: Not on file  Relationships  . Social Herbalist on phone: Not on file    Gets together: Not on file    Attends religious service: Not on file    Active member of club or organization: Not on file    Attends meetings of clubs or organizations: Not on file    Relationship status: Not on file  . Intimate partner violence    Fear of current or ex partner: Not on file   Emotionally abused: Not on file    Physically abused: Not on file    Forced sexual activity: Not on file  Other Topics Concern  . Not on file  Social History Narrative  . Not on file    Outpatient Medications Prior to Visit  Medication Sig Dispense Refill  . Alirocumab (PRALUENT) 75 MG/ML SOAJ Inject 1 mL (75 mg total) into the skin every 14 days.    Marland Kitchen aspirin 81 MG tablet Take 1 tablet by mouth daily.    Marland Kitchen JANUMET XR 50-1000 MG TB24 Take 1 tablet by mouth daily. 90 tablet 1  . lisinopril (PRINIVIL,ZESTRIL) 10 MG tablet Take 10 mg by mouth daily.    . metoprolol succinate (TOPROL-XL) 25 MG 24 hr tablet Take 1 tablet by mouth daily.    . nitroGLYCERIN (NITROSTAT) 0.4 MG SL tablet Place 1 tablet under the tongue every 5 minutes as needed for up to 30 days for chest pain.    . Omega-3 1000 MG CAPS Take 1 capsule by mouth daily.    . ranitidine (ZANTAC) 150 MG tablet Take 1 tablet by mouth daily.     No facility-administered medications prior to visit.  Allergies  Allergen Reactions  . Rosuvastatin Other (See Comments)    myalgia myalgia   . Statins Other (See Comments)    ROS Review of Systems  Constitutional: Negative for chills, diaphoresis, fatigue, fever and unexpected weight change.  HENT: Negative for congestion and postnasal drip.   Respiratory: Negative for cough, chest tightness and shortness of breath.   Cardiovascular: Negative.   Gastrointestinal: Negative for abdominal pain, nausea and vomiting.  Endocrine: Negative for polyphagia and polyuria.  Genitourinary: Negative.   Musculoskeletal: Negative for arthralgias and myalgias.  Allergic/Immunologic: Negative for immunocompromised state.  Neurological: Negative for numbness and headaches.  Hematological: Does not bruise/bleed easily.  Psychiatric/Behavioral: Negative.       Objective:    Physical Exam  Constitutional: He is oriented to person, place, and time. He appears well-developed and  well-nourished. No distress.  HENT:  Head: Normocephalic and atraumatic.  Right Ear: External ear normal.  Left Ear: External ear normal.  Eyes: Conjunctivae are normal. Right eye exhibits no discharge. Left eye exhibits no discharge. No scleral icterus.  Neck: No JVD present. No tracheal deviation present.  Pulmonary/Chest: Effort normal. No stridor.  Neurological: He is alert and oriented to person, place, and time.  Skin: Skin is warm and dry. He is not diaphoretic.  Psychiatric: He has a normal mood and affect. His behavior is normal.    Ht 5\' 4"  (1.626 m)   BMI 28.32 kg/m  Wt Readings from Last 3 Encounters:  12/09/18 165 lb (74.8 kg)  12/06/18 165 lb (74.8 kg)   BP Readings from Last 3 Encounters:  12/09/18 120/84  12/06/18 124/80   Guideline developer:  UpToDate (see UpToDate for funding source) Date Released: June 2014  Health Maintenance Due  Topic Date Due  . HEMOGLOBIN A1C  1969-11-09  . FOOT EXAM  12/01/1979  . HIV Screening  11/30/1984  . TETANUS/TDAP  11/30/1988  . INFLUENZA VACCINE  05/21/2019    There are no preventive care reminders to display for this patient.  No results found for: TSH No results found for: WBC, HGB, HCT, MCV, PLT Lab Results  Component Value Date   NA 136 12/06/2018   K 4.6 12/06/2018   CO2 24 12/06/2018   GLUCOSE 119 (H) 12/06/2018   BUN 14 12/06/2018   CREATININE 1.10 12/06/2018   CALCIUM 9.0 12/06/2018   GFR 71.14 12/06/2018   No results found for: CHOL No results found for: HDL No results found for: LDLCALC No results found for: TRIG No results found for: CHOLHDL No results found for: HGBA1C    Assessment & Plan:   Problem List Items Addressed This Visit    None    Visit Diagnoses    Viral disease exposure    -  Primary      No orders of the defined types were placed in this encounter.   Follow-up: Return if symptoms worsen or fail to improve.    Covid testing ordered.  Virtual Visit via Video Note   I connected with Joseph Horton on 06/06/19 at 10:00 AM EDT by a video enabled telemedicine application and verified that I am speaking with the correct person using two identifiers.  Location: Patient: home Provider:    I discussed the limitations of evaluation and management by telemedicine and the availability of in person appointments. The patient expressed understanding and agreed to proceed.  History of Present Illness:    Observations/Objective:   Assessment and Plan:   Follow Up Instructions:  I discussed the assessment and treatment plan with the patient. The patient was provided an opportunity to ask questions and all were answered. The patient agreed with the plan and demonstrated an understanding of the instructions.   The patient was advised to call back or seek an in-person evaluation if the symptoms worsen or if the condition fails to improve as anticipated.  I provided 10 minutes of non-face-to-face time during this encounter.   Libby Maw, MD

## 2019-04-28 NOTE — Telephone Encounter (Signed)
  Patient is concerned about work exposure to [employer]- he has been informed of positive co worker- but his company has informed him that his exposure was minimal. Patient is not sure and wants to talk to PCP about this. Reason for Disposition . [1] COVID-19 EXPOSURE (Close Contact) AND [2] within last 14 days BUT [3] NO symptoms  Answer Assessment - Initial Assessment Questions 1. CLOSE CONTACT: "Who is the person with the confirmed or suspected COVID-19 infection that you were exposed to?"     Exposure at work 2. PLACE of CONTACT: "Where were you when you were exposed to COVID-19?" (e.g., home, school, medical waiting room; which city?)     Workplace exposure 3. TYPE of CONTACT: "How much contact was there?" (e.g., sitting next to, live in same house, work in same office, same building)     works at same place- patient is not sure who the person is 4. 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 DATE of CONTACT: "When did you have contact with a COVID-19 patient?" (e.g., how many days ago)     7/7 6. TRAVEL: "Have you traveled out of the country recently?" If so, "When and where?"     * Also ask about out-of-state travel, since the CDC has identified some high-risk cities for community spread in the [place].     * Note: Travel becomes less relevant if there is widespread community transmission where the patient lives.     No travel 7. COMMUNITY SPREAD: "Are there lots of cases of COVID-19 (community spread) where you live?" (See public health department website, if unsure)       minor 8. SYMPTOMS: "Do you have any symptoms?" (e.g., fever, cough, breathing difficulty)     No  9. PREGNANCY OR POSTPARTUM: "Is there any chance you are pregnant?" "When was your last menstrual period?" "Did you deliver in the last 2 weeks?"      n/a 10. HIGH RISK: "Do you have any heart or lung problems? Do you have a weak immune system?" (e.g., CHF, COPD, asthma, HIV positive, chemotherapy, renal failure, diabetes mellitus, sickle cell anemia)       Diabetes, hx heart attack  Protocols used: CORONAVIRUS (COVID-19) EXPOSURE-A-AH

## 2019-04-29 ENCOUNTER — Other Ambulatory Visit: Payer: BC Managed Care – PPO

## 2019-04-29 DIAGNOSIS — Z20822 Contact with and (suspected) exposure to covid-19: Secondary | ICD-10-CM

## 2019-07-11 DIAGNOSIS — Z888 Allergy status to other drugs, medicaments and biological substances status: Secondary | ICD-10-CM | POA: Diagnosis not present

## 2019-07-11 DIAGNOSIS — Z7982 Long term (current) use of aspirin: Secondary | ICD-10-CM | POA: Diagnosis not present

## 2019-07-11 DIAGNOSIS — E7849 Other hyperlipidemia: Secondary | ICD-10-CM | POA: Diagnosis not present

## 2019-07-11 DIAGNOSIS — I2510 Atherosclerotic heart disease of native coronary artery without angina pectoris: Secondary | ICD-10-CM | POA: Diagnosis not present

## 2019-08-12 ENCOUNTER — Encounter: Payer: Self-pay | Admitting: Family Medicine

## 2019-08-15 NOTE — Telephone Encounter (Signed)
Lets have him follow low carb diet and continue to monitor glucose this week  with f/u with Dr. Ethelene Hal next week to discuss.  Thanks!

## 2019-09-06 ENCOUNTER — Encounter: Payer: Self-pay | Admitting: Family Medicine

## 2019-09-06 DIAGNOSIS — Z0000 Encounter for general adult medical examination without abnormal findings: Secondary | ICD-10-CM

## 2019-09-08 ENCOUNTER — Other Ambulatory Visit (INDEPENDENT_AMBULATORY_CARE_PROVIDER_SITE_OTHER): Payer: BC Managed Care – PPO

## 2019-09-08 ENCOUNTER — Other Ambulatory Visit: Payer: Self-pay

## 2019-09-08 DIAGNOSIS — E119 Type 2 diabetes mellitus without complications: Secondary | ICD-10-CM | POA: Diagnosis not present

## 2019-09-08 DIAGNOSIS — Z0000 Encounter for general adult medical examination without abnormal findings: Secondary | ICD-10-CM | POA: Diagnosis not present

## 2019-09-08 LAB — MICROALBUMIN / CREATININE URINE RATIO
Creatinine,U: 119.7 mg/dL
Microalb Creat Ratio: 0.6 mg/g (ref 0.0–30.0)
Microalb, Ur: <0.7 mg/dL (ref 0.0–1.9)

## 2019-09-08 LAB — LIPID PANEL
Cholesterol: 173 mg/dL (ref 0–200)
HDL: 43.7 mg/dL (ref >39.00)
LDL Cholesterol: 96 mg/dL (ref 0–99)
NonHDL: 129.68
Total CHOL/HDL Ratio: 4
Triglycerides: 168 mg/dL — ABNORMAL HIGH (ref 0.0–149.0)
VLDL: 33.6 mg/dL (ref 0.0–40.0)

## 2019-09-08 LAB — URINALYSIS, ROUTINE W REFLEX MICROSCOPIC
Bilirubin Urine: NEGATIVE
Hgb urine dipstick: NEGATIVE
Ketones, ur: NEGATIVE
Leukocytes,Ua: NEGATIVE
Nitrite: NEGATIVE
RBC / HPF: NONE SEEN (ref 0–?)
Specific Gravity, Urine: 1.015 (ref 1.000–1.030)
Total Protein, Urine: NEGATIVE
Urine Glucose: NEGATIVE
Urobilinogen, UA: 0.2 (ref 0.0–1.0)
WBC, UA: NONE SEEN (ref 0–?)
pH: 6 (ref 5.0–8.0)

## 2019-09-08 LAB — CBC
HCT: 46.4 % (ref 39.0–52.0)
Hemoglobin: 16.1 g/dL (ref 13.0–17.0)
MCHC: 34.6 g/dL (ref 30.0–36.0)
MCV: 96.4 fl (ref 78.0–100.0)
Platelets: 208 10*3/uL (ref 150.0–400.0)
RBC: 4.82 Mil/uL (ref 4.22–5.81)
RDW: 13 % (ref 11.5–15.5)
WBC: 6.9 10*3/uL (ref 4.0–10.5)

## 2019-09-08 LAB — COMPREHENSIVE METABOLIC PANEL
ALT: 45 U/L (ref 0–53)
AST: 22 U/L (ref 0–37)
Albumin: 4.4 g/dL (ref 3.5–5.2)
Alkaline Phosphatase: 90 U/L (ref 39–117)
BUN: 15 mg/dL (ref 6–23)
CO2: 26 mEq/L (ref 19–32)
Calcium: 9.1 mg/dL (ref 8.4–10.5)
Chloride: 103 mEq/L (ref 96–112)
Creatinine, Ser: 1.12 mg/dL (ref 0.40–1.50)
GFR: 69.46 mL/min (ref >60.00)
Glucose, Bld: 123 mg/dL — ABNORMAL HIGH (ref 70–99)
Potassium: 4.7 mEq/L (ref 3.5–5.1)
Sodium: 136 mEq/L (ref 135–145)
Total Bilirubin: 0.5 mg/dL (ref 0.2–1.2)
Total Protein: 7 g/dL (ref 6.0–8.3)

## 2019-09-08 LAB — PSA: PSA: 1.01 ng/mL (ref 0.10–4.00)

## 2019-09-08 LAB — HEMOGLOBIN A1C: Hgb A1c MFr Bld: 6.3 % (ref 4.6–6.5)

## 2019-09-08 NOTE — Addendum Note (Signed)
Addended by: Lynnea Ferrier on: 09/08/2019 08:22 AM   Modules accepted: Orders

## 2019-09-12 ENCOUNTER — Encounter: Payer: Self-pay | Admitting: Family Medicine

## 2019-09-13 ENCOUNTER — Telehealth (INDEPENDENT_AMBULATORY_CARE_PROVIDER_SITE_OTHER): Payer: BC Managed Care – PPO | Admitting: Family Medicine

## 2019-09-13 ENCOUNTER — Encounter: Payer: Self-pay | Admitting: Family Medicine

## 2019-09-13 ENCOUNTER — Other Ambulatory Visit: Payer: Self-pay

## 2019-09-13 DIAGNOSIS — E119 Type 2 diabetes mellitus without complications: Secondary | ICD-10-CM | POA: Diagnosis not present

## 2019-09-13 DIAGNOSIS — Z789 Other specified health status: Secondary | ICD-10-CM | POA: Diagnosis not present

## 2019-09-13 DIAGNOSIS — Z0000 Encounter for general adult medical examination without abnormal findings: Secondary | ICD-10-CM

## 2019-09-13 DIAGNOSIS — T466X5A Adverse effect of antihyperlipidemic and antiarteriosclerotic drugs, initial encounter: Secondary | ICD-10-CM

## 2019-09-13 NOTE — Progress Notes (Signed)
Established Patient Office Visit  Subjective:  Patient ID: Joseph Horton, male    DOB: 1971/05/29  Age: 48 y.o. MRN: [id number]  CC:  Chief Complaint  Patient presents with  . Follow-up    on labs    HPI Joseph Horton presents for follow-up of his diabetes.  Continues to take Janumet daily without issue.  He is exercising by walking almost 10,000 steps every day.  He has been able to lose some weight.  Hemoglobin A1c is excellent at 6.2.  Blood pressure has been well controlled on his current regimen.  He is taking a PC KS inhibitor purulent with an LDL cholesterol of 96.  He is statin tolerant due to myalgias, elevating liver enzymes.  History of coronary artery disease status post stent placement.  He is seeing the dentist regularly.  Eye check will be in January.  History reviewed. No pertinent past medical history.  History reviewed. No pertinent surgical history.  Family History  Problem Relation Age of Onset  . Asthma Mother   . Hyperlipidemia Mother   . Heart disease Father     Social History   Socioeconomic History  . Marital status: Significant Other    Spouse name: Not on file  . Number of children: Not on file  . Years of education: Not on file  . Highest education level: Not on file  Occupational History  . Not on file  Social Needs  . Financial resource strain: Not on file  . Food insecurity    Worry: Not on file    Inability: Not on file  . Transportation needs    Medical: Not on file    Non-medical: Not on file  Tobacco Use  . Smoking status: Never Smoker  . Smokeless tobacco: Never Used  Substance and Sexual Activity  . Alcohol use: Yes    Comment: 2 glasses of wine per week.  . Drug use: Never  . Sexual activity: Not on file  Lifestyle  . Physical activity    Days per week: Not on file    Minutes per session: Not on file  . Stress: Not on file  Relationships  . Social Herbalist on phone: Not on file    Gets together: Not on file     Attends religious service: Not on file    Active member of club or organization: Not on file    Attends meetings of clubs or organizations: Not on file    Relationship status: Not on file  . Intimate partner violence    Fear of current or ex partner: Not on file    Emotionally abused: Not on file    Physically abused: Not on file    Forced sexual activity: Not on file  Other Topics Concern  . Not on file  Social History Narrative  . Not on file    Outpatient Medications Prior to Visit  Medication Sig Dispense Refill  . Alirocumab (PRALUENT) 75 MG/ML SOAJ Inject 1 mL (75 mg total) into the skin every 14 days.    Marland Kitchen aspirin 81 MG tablet Take 1 tablet by mouth daily.    Marland Kitchen JANUMET XR 50-1000 MG TB24 Take 1 tablet by mouth daily. 90 tablet 1  . lisinopril (PRINIVIL,ZESTRIL) 10 MG tablet Take 10 mg by mouth daily.    . metoprolol succinate (TOPROL-XL) 25 MG 24 hr tablet Take 1 tablet by mouth daily.    . nitroGLYCERIN (NITROSTAT) 0.4 MG SL tablet Place 1  tablet under the tongue every 5 minutes as needed for up to 30 days for chest pain.    . Omega-3 1000 MG CAPS Take 1 capsule by mouth daily.    . ranitidine (ZANTAC) 150 MG tablet Take 1 tablet by mouth daily.     No facility-administered medications prior to visit.     Allergies  Allergen Reactions  . Rosuvastatin Other (See Comments)    myalgia myalgia   . Statins Other (See Comments)    ROS Review of Systems  Constitutional: Negative.   HENT: Negative.   Eyes: Negative for photophobia and visual disturbance.  Respiratory: Negative.   Cardiovascular: Negative.   Gastrointestinal: Negative.   Endocrine: Negative for polyphagia and polyuria.  Genitourinary: Negative.   Musculoskeletal: Negative for gait problem.  Allergic/Immunologic: Negative for immunocompromised state.  Neurological: Negative for speech difficulty.  Psychiatric/Behavioral: Negative.       Objective:    Physical Exam  Constitutional: He is  oriented to person, place, and time. He appears well-developed and well-nourished. No distress.  HENT:  Head: Normocephalic and atraumatic.  Right Ear: External ear normal.  Left Ear: External ear normal.  Eyes: Conjunctivae are normal. Right eye exhibits no discharge. Left eye exhibits no discharge. No scleral icterus.  Neck: No JVD present. No tracheal deviation present.  Pulmonary/Chest: Effort normal. No stridor.  Neurological: He is alert and oriented to person, place, and time.  Skin: He is not diaphoretic.  Psychiatric: He has a normal mood and affect. His behavior is normal.    There were no vitals taken for this visit. Wt Readings from Last 3 Encounters:  12/09/18 165 lb (74.8 kg)  12/06/18 165 lb (74.8 kg)   BP Readings from Last 3 Encounters:  12/09/18 120/84  12/06/18 124/80   Guideline developer:  UpToDate (see UpToDate for funding source) Date Released: June 2014  Health Maintenance Due  Topic Date Due  . FOOT EXAM  12/01/1979  . HIV Screening  11/30/1984  . TETANUS/TDAP  11/30/1988    There are no preventive care reminders to display for this patient.  No results found for: TSH Lab Results  Component Value Date   WBC 6.9 09/08/2019   HGB 16.1 09/08/2019   HCT 46.4 09/08/2019   MCV 96.4 09/08/2019   PLT 208.0 09/08/2019   Lab Results  Component Value Date   NA 136 09/08/2019   K 4.7 09/08/2019   CO2 26 09/08/2019   GLUCOSE 123 (H) 09/08/2019   BUN 15 09/08/2019   CREATININE 1.12 09/08/2019   BILITOT 0.5 09/08/2019   ALKPHOS 90 09/08/2019   AST 22 09/08/2019   ALT 45 09/08/2019   PROT 7.0 09/08/2019   ALBUMIN 4.4 09/08/2019   CALCIUM 9.1 09/08/2019   GFR 69.46 09/08/2019   Lab Results  Component Value Date   CHOL 173 09/08/2019   Lab Results  Component Value Date   HDL 43.70 09/08/2019   Lab Results  Component Value Date   LDLCALC 96 09/08/2019   Lab Results  Component Value Date   TRIG 168.0 (H) 09/08/2019   Lab Results   Component Value Date   CHOLHDL 4 09/08/2019   Lab Results  Component Value Date   HGBA1C 6.3 09/08/2019      Assessment & Plan:   Problem List Items Addressed This Visit      Endocrine   Type 2 diabetes mellitus without complication, without long-term current use of insulin ([place])     Other   Healthcare  maintenance   Adverse reaction to statin medication   Statin intolerance - Primary      No orders of the defined types were placed in this encounter.   Follow-up: Return in about 6 months (around 03/12/2020).   Patient will continue Janumet and try to lose 10 to 15 pounds.  Was optimistic that we may be able to discontinue the Janumet or control his diabetes with Metformin alone.  Suggested that this would help lower his LDL cholesterol further as well.  Patient was seen at work alone by me.  Virtual Visit via Video Note  I connected with Joseph Horton on 09/13/19 at 11:30 AM EST by a video enabled telemedicine application and verified that I am speaking with the correct person using two identifiers.  Location: Patient: work Provider:    I discussed the limitations of evaluation and management by telemedicine and the availability of in person appointments. The patient expressed understanding and agreed to proceed.  History of Present Illness:    Observations/Objective:   Assessment and Plan:   Follow Up Instructions:    I discussed the assessment and treatment plan with the patient. The patient was provided an opportunity to ask questions and all were answered. The patient agreed with the plan and demonstrated an understanding of the instructions.   The patient was advised to call back or seek an in-person evaluation if the symptoms worsen or if the condition fails to improve as anticipated.  I provided 25 minutes of non-face-to-face time during this encounter.   Libby Maw, MD

## 2019-09-20 ENCOUNTER — Other Ambulatory Visit: Payer: Self-pay

## 2019-09-20 ENCOUNTER — Encounter: Payer: Self-pay | Admitting: Gastroenterology

## 2019-09-20 ENCOUNTER — Telehealth (INDEPENDENT_AMBULATORY_CARE_PROVIDER_SITE_OTHER): Payer: BC Managed Care – PPO | Admitting: Gastroenterology

## 2019-09-20 ENCOUNTER — Telehealth: Payer: Self-pay

## 2019-09-20 VITALS — Ht 64.0 in | Wt 163.0 lb

## 2019-09-20 DIAGNOSIS — Z860100 Personal history of colon polyps, unspecified: Secondary | ICD-10-CM

## 2019-09-20 DIAGNOSIS — R932 Abnormal findings on diagnostic imaging of liver and biliary tract: Secondary | ICD-10-CM

## 2019-09-20 DIAGNOSIS — R7401 Elevation of levels of liver transaminase levels: Secondary | ICD-10-CM

## 2019-09-20 DIAGNOSIS — Z1159 Encounter for screening for other viral diseases: Secondary | ICD-10-CM

## 2019-09-20 DIAGNOSIS — K760 Fatty (change of) liver, not elsewhere classified: Secondary | ICD-10-CM | POA: Diagnosis not present

## 2019-09-20 MED ORDER — CLENPIQ 10-3.5-12 MG-GM -GM/160ML PO SOLN: 1.0000 | Freq: Once | ORAL | 0 refills | Status: AC

## 2019-09-20 NOTE — Telephone Encounter (Signed)
Called patient in regards to virtual ov that is today. He needs to answer some questions first

## 2019-09-20 NOTE — Progress Notes (Signed)
Chief Complaint: History of colon polyps, polyp surveillance, fatty liver   GI Hx: Joseph Horton is a 48 y.o. male with a hx of CAD w/ MI in 2015 s/p PCI w/ 2 stents placed in right coronary artery, DM (A1c 6.4), HLD, who follows in the GI clinic for the following:  1) History of colon polyps: Colonoscopy was in 2018 with [facility] which was notable for polyps, with recommendation to repeat in 2 years (unclear reason; requesting records today).  Otherwise, he is without lower GI symptoms to include no hematochezia, change in bowel habits, abdominal pain  2) Fatty liver disease/elevated ALT: Previously evaluated by [facility] physician 2019, to include CT as outlined below.  Very mildly elevated liver enzymes in 2018, with reported improvement in 2019 (records requested, not received).  Was previously drinking approximately 10 drinks/week (wine/beer), but stopped since being told about liver issues.  Improved BMI from 29 to 28 with increased exercise/activity and healthy eating. -04/2017 with AST/ALT 33/45, ALP 79, T bili 0.8 with normal albumin -08/2019 AST/ALT 22/45, ALP 90 -CT abdomen (10/2017) with ?fine liver surface irregularity and slight hypertrophy of lateral left lobe, unable to exclude early cirrhosis.  0.5 cm hypodensity in the inferior right liver lobe.  Normal ducts, normal pancreas, spleen.  Radiologist recommended hepatic elastography along with MRI three-phase in 3 to 6 months for the small hypodensity.  Interestingly, he even sought a 2nd opinion with another GI in [place] (where he is from) in 2019, to include MRI liver, which he reports looked normal.  This report was sent to his physician at [facility].  Requested the MRI report; not yet received.   HPI:    Due to current restrictions/limitations of in-office visits due to the COVID-19 pandemic, this scheduled clinical appointment was converted to a telehealth virtual consultation using [employer].  Patient unable to connect  through [place], so converted to telephone call.  -Time of medical discussion: 23 minutes -The patient did consent to this virtual visit and is aware of possible charges through their insurance for this visit.  -Names of all parties present: Joseph Horton (patient), Gerrit Heck, DO, [facility] (physician) -Patient location: Home -Physician location: Office  Joseph Horton is a 48 y.o. male presenting for telehealth appointment for follow-up.  He was last seen by me in 12/2018 as outlined above. Colonoscopy scheduled for 09/30/19 for ongoing polyp surveillance.  Has increased daily exercise/activity and improved diet with modest weight loss.  He is otherwise without any complaints today.  Overall he feels well.  Past medical history, past surgical history, social history, family history, medications, and allergies reviewed in the chart and with patient.    PMHx: CAD with MI in 2015, PCI with 2 stents placed to right coronary artery-2015 Diabetes Hyperlipidemia Fatty liver disease History of colon polyps  PSHx: Cardiac catheterization Coronary angioplasty  Past Medical History:  Diagnosis Date  . History of colon polyps      Past Surgical History:  Procedure Laterality Date  . COLONOSCOPY     2018   Family History  Problem Relation Age of Onset  . Asthma Mother   . Hyperlipidemia Mother   . Heart disease Father   . Esophageal cancer Neg Hx    Social History   Tobacco Use  . Smoking status: Never Smoker  . Smokeless tobacco: Never Used  Substance Use Topics  . Alcohol use: Yes    Comment: 2  glasses of wine per week.  . Drug use: Never   Current Outpatient Medications  Medication Sig Dispense Refill  . Alirocumab (PRALUENT) 75 MG/ML SOAJ Inject 1 mL (75 mg total) into the skin every 14 days.    Marland Kitchen aspirin 81 MG tablet Take 1 tablet by mouth daily.    Marland Kitchen JANUMET XR 50-1000 MG TB24 Take 1 tablet by mouth daily. 90 tablet 1  . lisinopril (PRINIVIL,ZESTRIL) 10 MG tablet  Take 10 mg by mouth daily.    . metoprolol succinate (TOPROL-XL) 25 MG 24 hr tablet Take 1 tablet by mouth daily.    . Omega-3 1000 MG CAPS Take 1 capsule by mouth daily.    . nitroGLYCERIN (NITROSTAT) 0.4 MG SL tablet Place 1 tablet under the tongue every 5 minutes as needed for up to 30 days for chest pain.     No current facility-administered medications for this visit.    Allergies  Allergen Reactions  . Crab [Shellfish Allergy]     Blue crab  . Rosuvastatin Other (See Comments)    myalgia myalgia   . Statins Other (See Comments)     Review of Systems: All systems reviewed and negative except where noted in HPI.     Physical Exam:    Complete physical exam not completed due to the nature of this telehealth communication.   Gen: Awake, alert, and oriented, and well communicative. Psych: Pleasant, cooperative, normal speech, thought processing seemingly intact   ASSESSMENT AND PLAN;   1) Colon cancer screening/Polyp surveillance -Due for age-appropriate, average risk CRC screening -Colonoscopy completed in 2018 at [facility].  Unsure indication for colonoscopy at that time, but was reportedly notable for polyps, with recommendation repeat in 2 years.  None of those records have been received for review.  Nonetheless, given age, lack of clarity regarding prior records, reasonable to proceed with repeat colonoscopy for both screening and surveillance  2) Elevated ALT 3) Fatty liver infiltration Very mildly elevated ALT with normal AST.  CT in 2019 with possible early cirrhotic changes, but otherwise no serologic evidence of impaired hepatic synthetic function. Given history of metabolic syndrome comorbidities (CAD, HLD, DM), favor NAFLD as the underlying etiology for elevated LAEs.  -We will again try to obtain previous MRI report from [facility] and if any extended serologic evaluation has been completed.  If unable to locate MRI, may consider repeat imaging to work-up hepatic  hypodensity as below. -Improvement in weight/BMI with increased activity/exercise.  Applauded those efforts today.  Maintaining healthy eating habits - Holding off on MRI elastography for now pending review of prior records -Repeat liver enzymes in 6 months again  4) Hepatic hypodensity 0.5 cm hypodensity noted in the right lobe of the liver on CT in 2019.  MRI completed in [place], with report sent to physicians at [facility]. -Obtain MRI report -As above, if unable to obtain, may consider repeat MRI in the near future  The indications, risks, and benefits of colonoscopy were explained to the patient in detail. Risks include but are not limited to bleeding, perforation, adverse reaction to medications, and cardiopulmonary compromise. Sequelae include but are not limited to the possibility of surgery, hospitalization, and mortality. The patient verbalized understanding and wished to proceed. All questions answered, referred to the scheduler and bowel prep ordered. Further recommendations pending results of the exam.     Lavena Bullion, DO, FACG  09/20/2019, 3:22 PM   Libby Maw,*

## 2019-09-20 NOTE — Patient Instructions (Signed)
You have been scheduled for a colonoscopy. Please follow written instructions given to you at your visit today.  Please pick up your prep supplies at the pharmacy within the next 1-3 days. If you use inhalers (even only as needed), please bring them with you on the day of your procedure. Your physician has requested that you go to www.startemmi.com and enter the access code given to you at your visit today. This web site gives a general overview about your procedure. However, you should still follow specific instructions given to you by our office regarding your preparation for the procedure.  It was a pleasure to see you today!  Vito Cirigliano, D.O.  

## 2019-09-23 NOTE — Progress Notes (Signed)
Addendum: Received MRI report from [facility], dated 01/11/2018, completed in her room and notable for the following:  -15 mm hepatic cyst in segment 6, with otherwise normal intrahepatic ducts.  Normal CBD at 4 mm.  No masses.  Otherwise normal liver size, contour. -Normal GB, spleen, pancreas -Small, bilateral renal cysts, not exceeding 3 mm -Otherwise normal findings  Per report, appears to be simple hepatic cyst 15 mm in size, with otherwise normal-appearing liver and no duct dilation.  No radiographic evidence of cirrhosis by this report.  Given size >1 cm, could consider repeat MRI in 1 year to ensure size stability, but otherwise no additional work-up needed at this time.

## 2019-09-28 ENCOUNTER — Ambulatory Visit (INDEPENDENT_AMBULATORY_CARE_PROVIDER_SITE_OTHER): Payer: BC Managed Care – PPO

## 2019-09-28 ENCOUNTER — Other Ambulatory Visit: Payer: Self-pay | Admitting: Gastroenterology

## 2019-09-28 DIAGNOSIS — Z1159 Encounter for screening for other viral diseases: Secondary | ICD-10-CM | POA: Diagnosis not present

## 2019-09-29 ENCOUNTER — Encounter: Payer: Self-pay | Admitting: Family Medicine

## 2019-09-29 LAB — SARS CORONAVIRUS 2 (TAT 6-24 HRS): SARS Coronavirus 2: NEGATIVE

## 2019-09-30 ENCOUNTER — Encounter: Payer: Self-pay | Admitting: Gastroenterology

## 2019-09-30 ENCOUNTER — Other Ambulatory Visit: Payer: Self-pay

## 2019-09-30 ENCOUNTER — Ambulatory Visit (AMBULATORY_SURGERY_CENTER): Payer: BC Managed Care – PPO | Admitting: Gastroenterology

## 2019-09-30 VITALS — BP 100/66 | HR 59 | Temp 98.3°F | Resp 13 | Ht 64.0 in | Wt 163.0 lb

## 2019-09-30 DIAGNOSIS — Z86010 Personal history of colonic polyps: Secondary | ICD-10-CM | POA: Diagnosis not present

## 2019-09-30 DIAGNOSIS — R7401 Elevation of levels of liver transaminase levels: Secondary | ICD-10-CM

## 2019-09-30 DIAGNOSIS — R932 Abnormal findings on diagnostic imaging of liver and biliary tract: Secondary | ICD-10-CM

## 2019-09-30 DIAGNOSIS — D125 Benign neoplasm of sigmoid colon: Secondary | ICD-10-CM | POA: Diagnosis not present

## 2019-09-30 DIAGNOSIS — K640 First degree hemorrhoids: Secondary | ICD-10-CM

## 2019-09-30 DIAGNOSIS — K760 Fatty (change of) liver, not elsewhere classified: Secondary | ICD-10-CM

## 2019-09-30 MED ORDER — SODIUM CHLORIDE 0.9 % IV SOLN: 500.0000 mL | Freq: Once | INTRAVENOUS | Status: DC

## 2019-09-30 NOTE — Progress Notes (Signed)
Called to room to assist during endoscopic procedure.  Patient ID and intended procedure confirmed with present staff. Received instructions for my participation in the procedure from the performing physician.  

## 2019-09-30 NOTE — Progress Notes (Signed)
Pt tolerated well. VSS. Awake and to recovery. 

## 2019-09-30 NOTE — Patient Instructions (Signed)
2 tiny polyps removed today.  Dr Bryan Lemma wants you to USE FIBER, FOR EXAMPLE CITRUCEL, FIBERCON, KONSYL OR METAMUCIL and RETURN TO GI CLINIC IN 6 MONTHS. YOUR NEXT COLONOSCOPY WILL BE IN 5 YEARS, OR SOONER DEPENDING ON PATHOLOGY REPORT.    YOU HAD AN ENDOSCOPIC PROCEDURE TODAY AT [facility]:   Refer to the procedure report that was given to you for any specific questions about what was found during the examination.  If the procedure report does not answer your questions, please call your gastroenterologist to clarify.  If you requested that your care partner not be given the details of your procedure findings, then the procedure report has been included in a sealed envelope for you to review at your convenience later.  YOU SHOULD EXPECT: Some feelings of bloating in the abdomen. Passage of more gas than usual.  Walking can help get rid of the air that was put into your GI tract during the procedure and reduce the bloating. If you had a lower endoscopy (such as a colonoscopy or flexible sigmoidoscopy) you may notice spotting of blood in your stool or on the toilet paper. If you underwent a bowel prep for your procedure, you may not have a normal bowel movement for a few days.  Please Note:  You might notice some irritation and congestion in your nose or some drainage.  This is from the oxygen used during your procedure.  There is no need for concern and it should clear up in a day or so.  SYMPTOMS TO REPORT IMMEDIATELY:   Following lower endoscopy (colonoscopy or flexible sigmoidoscopy):  Excessive amounts of blood in the stool  Significant tenderness or worsening of abdominal pains  Swelling of the abdomen that is new, acute  Fever of 100F or higher   Following upper endoscopy (EGD)  Vomiting of blood or coffee ground material  New chest pain or pain under the shoulder blades  Painful or persistently difficult swallowing  New shortness of breath  Fever of 100F or  higher  Black, tarry-looking stools  For urgent or emergent issues, a gastroenterologist can be reached at any hour by calling [phone].   DIET:  We do recommend a small meal at first, but then you may proceed to your regular diet.  Drink plenty of fluids but you should avoid alcoholic beverages for 24 hours.  ACTIVITY:  You should plan to take it easy for the rest of today and you should NOT DRIVE or use heavy machinery until tomorrow (because of the sedation medicines used during the test).    FOLLOW UP: Our staff will call the number listed on your records 48-72 hours following your procedure to check on you and address any questions or concerns that you may have regarding the information given to you following your procedure. If we do not reach you, we will leave a message.  We will attempt to reach you two times.  During this call, we will ask if you have developed any symptoms of COVID 19. If you develop any symptoms (ie: fever, flu-like symptoms, shortness of breath, cough etc.) before then, please call [phone].  If you test positive for Covid 19 in the 2 weeks post procedure, please call and report this information to [place].    If any biopsies were taken you will be contacted by phone or by letter within the next 1-3 weeks.  Please call us at [phone] if you have not heard about the biopsies in  3 weeks.    SIGNATURES/CONFIDENTIALITY: You and/or your care partner have signed paperwork which will be entered into your electronic medical record.  These signatures attest to the fact that that the information above on your After Visit Summary has been reviewed and is understood.  Full responsibility of the confidentiality of this discharge information lies with you and/or your care-partner.

## 2019-09-30 NOTE — Progress Notes (Signed)
[id number]

## 2019-09-30 NOTE — Op Note (Signed)
[facility] Patient Name: Joseph Horton Procedure Date: 09/30/2019 10:17 AM MRN: [id number] Endoscopist: Gerrit Heck , MD Age: 48 Referring MD:  Date of Birth: [birth year] Gender: Male Account #: [id number] Procedure:                Colonoscopy Indications:              High risk colon cancer surveillance: Personal                            history of colonic polyps                           Colonoscopy in 2018 at outside facility with 2                            polyps (unknown size, histology, location), with                            recommendation to repeat in 2 years for ongoing                            surveillance. He is otherwise without lower GI                            symptoms. Medicines:                Monitored Anesthesia Care Procedure:                Pre-Anesthesia Assessment:                           - Prior to the procedure, a History and Physical                            was performed, and patient medications and                            allergies were reviewed. The patient's tolerance of                            previous anesthesia was also reviewed. The risks                            and benefits of the procedure and the sedation                            options and risks were discussed with the patient.                            All questions were answered, and informed consent                            was obtained. Prior Anticoagulants: The patient has  taken no previous anticoagulant or antiplatelet                            agents. ASA Grade Assessment: II - A patient with                            mild systemic disease. After reviewing the risks                            and benefits, the patient was deemed in                            satisfactory condition to undergo the procedure.                           After obtaining informed consent, the colonoscope                            was passed  under direct vision. Throughout the                            procedure, the patient's blood pressure, pulse, and                            oxygen saturations were monitored continuously. The                            Colonoscope was introduced through the anus and                            advanced to the the terminal ileum. The colonoscopy                            was performed without difficulty. The patient                            tolerated the procedure well. The quality of the                            bowel preparation was excellent. The terminal                            ileum, ileocecal valve, appendiceal orifice, and                            rectum were photographed. Scope In: 10:32:32 AM Scope Out: 10:48:14 AM Scope Withdrawal Time: 0 hours 13 minutes 30 seconds  Total Procedure Duration: 0 hours 15 minutes 42 seconds  Findings:                 The perianal and digital rectal examinations were                            normal.  Two sessile polyps were found in the sigmoid colon.                            The polyps were 3 to 4 mm in size. These polyps                            were removed with a cold snare. Resection and                            retrieval were complete. Estimated blood loss was                            minimal.                           Non-bleeding internal hemorrhoids were found during                            retroflexion. The hemorrhoids were small.                           The exam was otherwise normal throughout the                            remainder of the colon.                           The terminal ileum appeared normal.                           Retroflexion in the right colon was performed. Complications:            No immediate complications. Estimated Blood Loss:     Estimated blood loss was minimal. Impression:               - Two 3 to 4 mm polyps in the sigmoid colon,                             removed with a cold snare. Resected and retrieved.                           - Non-bleeding internal hemorrhoids.                           - The examined portion of the ileum was normal. Recommendation:           - Patient has a contact number available for                            emergencies. The signs and symptoms of potential                            delayed complications were discussed with the  patient. Return to normal activities tomorrow.                            Written discharge instructions were provided to the                            patient.                           - Resume previous diet.                           - Continue present medications.                           - Await pathology results.                           - Given prior polyp history, recommend repeat                            colonoscopy in 5 years for ongoing surveillance, or                            sooner based on pathology results.                           - Use fiber, for example Citrucel, Fibercon, Konsyl                            or Metamucil.                           - Return to GI clinic in 6 months. Gerrit Heck, MD 09/30/2019 10:55:27 AM

## 2019-10-03 ENCOUNTER — Telehealth: Payer: Self-pay | Admitting: Gastroenterology

## 2019-10-03 NOTE — Telephone Encounter (Signed)
Called and spoke with patient-patient is requesting the results of the patho report from procedure; patient advised those results are not in Epic at this time and when they are resulted the office would call him or send the results via My Chart; Patient verbalized understanding of information/instructions;  Patient was advised to call the office at [phone] if questions/concerns arise;

## 2019-10-03 NOTE — Telephone Encounter (Signed)
Left message for patient to call back to the office;  

## 2019-10-04 ENCOUNTER — Telehealth: Payer: Self-pay | Admitting: *Deleted

## 2019-10-04 NOTE — Telephone Encounter (Signed)
  Follow up Call-  Call back number 09/30/2019  Post procedure Call Back phone  # [phone]  Permission to leave phone message Yes     Patient questions:  Do you have a fever, pain , or abdominal swelling? No. Pain Score  0 *  Have you tolerated food without any problems? Yes.    Have you been able to return to your normal activities? Yes.    Do you have any questions about your discharge instructions: Diet   No. Medications  No. Follow up visit  No.  Do you have questions or concerns about your Care? No.  Actions: * If pain score is 4 or above: No action needed, pain <4.   1. Have you developed a fever since your procedure? no  2.   Have you had an respiratory symptoms (SOB or cough) since your procedure? no  3.   Have you tested positive for COVID 19 since your procedure no  4.   Have you had any family members/close contacts diagnosed with the COVID 19 since your procedure?  no   If yes to any of these questions please route to Joylene John, RN and Alphonsa Gin, Therapist, sports.

## 2019-10-06 ENCOUNTER — Encounter: Payer: Self-pay | Admitting: Gastroenterology

## 2019-11-27 ENCOUNTER — Other Ambulatory Visit: Payer: Self-pay | Admitting: Family Medicine

## 2019-11-27 DIAGNOSIS — E119 Type 2 diabetes mellitus without complications: Secondary | ICD-10-CM

## 2020-01-09 DIAGNOSIS — E7849 Other hyperlipidemia: Secondary | ICD-10-CM | POA: Diagnosis not present

## 2020-01-09 DIAGNOSIS — Z7982 Long term (current) use of aspirin: Secondary | ICD-10-CM | POA: Diagnosis not present

## 2020-01-09 DIAGNOSIS — I2510 Atherosclerotic heart disease of native coronary artery without angina pectoris: Secondary | ICD-10-CM | POA: Diagnosis not present

## 2020-01-09 DIAGNOSIS — Z888 Allergy status to other drugs, medicaments and biological substances status: Secondary | ICD-10-CM | POA: Diagnosis not present

## 2020-01-10 DIAGNOSIS — I2510 Atherosclerotic heart disease of native coronary artery without angina pectoris: Secondary | ICD-10-CM | POA: Diagnosis not present

## 2020-01-12 ENCOUNTER — Other Ambulatory Visit: Payer: Self-pay

## 2020-01-13 ENCOUNTER — Ambulatory Visit (INDEPENDENT_AMBULATORY_CARE_PROVIDER_SITE_OTHER): Payer: BC Managed Care – PPO

## 2020-01-13 ENCOUNTER — Ambulatory Visit (INDEPENDENT_AMBULATORY_CARE_PROVIDER_SITE_OTHER): Payer: BC Managed Care – PPO | Admitting: Family Medicine

## 2020-01-13 ENCOUNTER — Encounter: Payer: Self-pay | Admitting: Family Medicine

## 2020-01-13 VITALS — BP 112/76 | HR 86 | Temp 97.3°F | Ht 64.0 in | Wt 163.4 lb

## 2020-01-13 DIAGNOSIS — E119 Type 2 diabetes mellitus without complications: Secondary | ICD-10-CM

## 2020-01-13 DIAGNOSIS — K760 Fatty (change of) liver, not elsewhere classified: Secondary | ICD-10-CM

## 2020-01-13 DIAGNOSIS — M25562 Pain in left knee: Secondary | ICD-10-CM | POA: Diagnosis not present

## 2020-01-13 DIAGNOSIS — Z0000 Encounter for general adult medical examination without abnormal findings: Secondary | ICD-10-CM | POA: Diagnosis not present

## 2020-01-13 LAB — COMPREHENSIVE METABOLIC PANEL
ALT: 30 U/L (ref 0–53)
AST: 21 U/L (ref 0–37)
Albumin: 4.6 g/dL (ref 3.5–5.2)
Alkaline Phosphatase: 85 U/L (ref 39–117)
BUN: 13 mg/dL (ref 6–23)
CO2: 29 mEq/L (ref 19–32)
Calcium: 9.8 mg/dL (ref 8.4–10.5)
Chloride: 103 mEq/L (ref 96–112)
Creatinine, Ser: 1.1 mg/dL (ref 0.40–1.50)
GFR: 70.82 mL/min (ref >60.00)
Glucose, Bld: 129 mg/dL — ABNORMAL HIGH (ref 70–99)
Potassium: 5.2 mEq/L — ABNORMAL HIGH (ref 3.5–5.1)
Sodium: 137 mEq/L (ref 135–145)
Total Bilirubin: 0.7 mg/dL (ref 0.2–1.2)
Total Protein: 7.3 g/dL (ref 6.0–8.3)

## 2020-01-13 LAB — URINALYSIS, ROUTINE W REFLEX MICROSCOPIC
Bilirubin Urine: NEGATIVE
Hgb urine dipstick: NEGATIVE
Ketones, ur: NEGATIVE
Leukocytes,Ua: NEGATIVE
Nitrite: NEGATIVE
Specific Gravity, Urine: 1.015 (ref 1.000–1.030)
Total Protein, Urine: NEGATIVE
Urine Glucose: NEGATIVE
Urobilinogen, UA: 0.2 (ref 0.0–1.0)
pH: 6 (ref 5.0–8.0)

## 2020-01-13 LAB — CBC
HCT: 47.7 % (ref 39.0–52.0)
Hemoglobin: 16.3 g/dL (ref 13.0–17.0)
MCHC: 34.1 g/dL (ref 30.0–36.0)
MCV: 96 fl (ref 78.0–100.0)
Platelets: 241 10*3/uL (ref 150.0–400.0)
RBC: 4.97 Mil/uL (ref 4.22–5.81)
RDW: 12.9 % (ref 11.5–15.5)
WBC: 6.6 10*3/uL (ref 4.0–10.5)

## 2020-01-13 LAB — HEMOGLOBIN A1C: Hgb A1c MFr Bld: 6.3 % (ref 4.6–6.5)

## 2020-01-13 LAB — GAMMA GT: GGT: 149 U/L — ABNORMAL HIGH (ref 7–51)

## 2020-01-13 NOTE — Patient Instructions (Addendum)
Mindfulness-Based Stress Reduction Mindfulness-based stress reduction (MBSR) is a program that helps people learn to practice mindfulness. Mindfulness is the practice of intentionally paying attention to the present moment. It can be learned and practiced through techniques such as education, breathing exercises, meditation, and yoga. MBSR includes several mindfulness techniques in one program. MBSR works best when you understand the treatment, are willing to try new things, and can commit to spending time practicing what you learn. MBSR training may include learning about:  How your emotions, thoughts, and reactions affect your body.  New ways to respond to things that cause negative thoughts to start (triggers).  How to notice your thoughts and let go of them.  Practicing awareness of everyday things that you normally do without thinking.  The techniques and goals of different types of meditation. What are the benefits of MBSR? MBSR can have many benefits, which include helping you to:  Develop self-awareness. This refers to knowing and understanding yourself.  Learn skills and attitudes that help you to participate in your own health care.  Learn new ways to care for yourself.  Be more accepting about how things are, and let things go.  Be less judgmental and approach things with an open mind.  Be patient with yourself and trust yourself more. MBSR has also been shown to:  Reduce negative emotions, such as depression and anxiety.  Improve memory and focus.  Change how you sense and approach pain.  Boost your body's ability to fight infections.  Help you connect better with other people.  Improve your sense of well-being. Follow these instructions at home:   Find a local in-person or online MBSR program.  Set aside some time regularly for mindfulness practice.  Find a mindfulness practice that works best for you. This may include one or more of the  following: ? Meditation. Meditation involves focusing your mind on a certain thought or activity. ? Breathing awareness exercises. These help you to stay present by focusing on your breath. ? Body scan. For this practice, you lie down and pay attention to each part of your body from head to toe. You can identify tension and soreness and intentionally relax parts of your body. ? Yoga. Yoga involves stretching and breathing, and it can improve your ability to move and be flexible. It can also provide an experience of testing your body's limits, which can help you release stress. ? Mindful eating. This way of eating involves focusing on the taste, texture, color, and smell of each bite of food. Because this slows down eating and helps you feel full sooner, it can be an important part of a weight-loss plan.  Find a podcast or recording that provides guidance for breathing awareness, body scan, or meditation exercises. You can listen to these any time when you have a free moment to rest without distractions.  Follow your treatment plan as told by your health care provider. This may include taking regular medicines and making changes to your diet or lifestyle as recommended. How to practice mindfulness To do a basic awareness exercise:  Find a comfortable place to sit.  Pay attention to the present moment. Observe your thoughts, feelings, and surroundings just as they are.  Avoid placing judgment on yourself, your feelings, or your surroundings. Make note of any judgment that comes up, and let it go.  Your mind may wander, and that is okay. Make note of when your thoughts drift, and return your attention to the present moment. To do   basic mindfulness meditation:  Find a comfortable place to sit. This may include a stable chair or a firm floor cushion. ? Sit upright with your back straight. Let your arms fall next to your side with your hands resting on your legs. ? If sitting in a chair, rest your  feet flat on the floor. ? If sitting on a cushion, cross your legs in front of you.  Keep your head in a neutral position with your chin dropped slightly. Relax your jaw and rest the tip of your tongue on the roof of your mouth. Drop your gaze to the floor. You can close your eyes if you like.  Breathe normally and pay attention to your breath. Feel the air moving in and out of your nose. Feel your belly expanding and relaxing with each breath.  Your mind may wander, and that is okay. Make note of when your thoughts drift, and return your attention to your breath.  Avoid placing judgment on yourself, your feelings, or your surroundings. Make note of any judgment or feelings that come up, let them go, and bring your attention back to your breath.  When you are ready, lift your gaze or open your eyes. Pay attention to how your body feels after the meditation. Where to find more information You can find more information about MBSR from:  Your health care provider.  Community-based meditation centers or programs.  Programs offered near you. Summary  Mindfulness-based stress reduction (MBSR) is a program that teaches you how to intentionally pay attention to the present moment. It is used with other treatments to help you cope better with daily stress, emotions, and pain.  MBSR focuses on developing self-awareness, which allows you to respond to life stress without judgment or negative emotions.  MBSR programs may involve learning different mindfulness practices, such as breathing exercises, meditation, yoga, body scan, or mindful eating. Find a mindfulness practice that works best for you, and set aside time for it on a regular basis. This information is not intended to replace advice given to you by your health care provider. Make sure you discuss any questions you have with your health care provider. Document Revised: 09/18/2017 Document Reviewed: 02/12/2017 Elsevier Patient Education   [place] Maintenance, Male Adopting a healthy lifestyle and getting preventive care are important in promoting health and wellness. Ask your health care provider about:  The right schedule for you to have regular tests and exams.  Things you can do on your own to prevent diseases and keep yourself healthy. What should I know about diet, weight, and exercise? Eat a healthy diet   Eat a diet that includes plenty of vegetables, fruits, low-fat dairy products, and lean protein.  Do not eat a lot of foods that are high in solid fats, added sugars, or sodium. Maintain a healthy weight Body mass index (BMI) is a measurement that can be used to identify possible weight problems. It estimates body fat based on height and weight. Your health care provider can help determine your BMI and help you achieve or maintain a healthy weight. Get regular exercise Get regular exercise. This is one of the most important things you can do for your health. Most adults should:  Exercise for at least 150 minutes each week. The exercise should increase your heart rate and make you sweat (moderate-intensity exercise).  Do strengthening exercises at least twice a week. This is in addition to the moderate-intensity exercise.  Spend less time sitting.  Even light physical activity can be beneficial. Watch cholesterol and blood lipids Have your blood tested for lipids and cholesterol at 50 years of age, then have this test every 5 years. You may need to have your cholesterol levels checked more often if:  Your lipid or cholesterol levels are high.  You are older than 50 years of age.  You are at high risk for heart disease. What should I know about cancer screening? Many types of cancers can be detected early and may often be prevented. Depending on your health history and family history, you may need to have cancer screening at various ages. This may include screening for:  Colorectal  cancer.  Prostate cancer.  Skin cancer.  Lung cancer. What should I know about heart disease, diabetes, and high blood pressure? Blood pressure and heart disease  High blood pressure causes heart disease and increases the risk of stroke. This is more likely to develop in people who have high blood pressure readings, are of African descent, or are overweight.  Talk with your health care provider about your target blood pressure readings.  Have your blood pressure checked: ? Every 3-5 years if you are 51-24 years of age. ? Every year if you are 65 years old or older.  If you are between the ages of 34 and 58 and are a current or former smoker, ask your health care provider if you should have a one-time screening for abdominal aortic aneurysm (AAA). Diabetes Have regular diabetes screenings. This checks your fasting blood sugar level. Have the screening done:  Once every three years after age 35 if you are at a normal weight and have a low risk for diabetes.  More often and at a younger age if you are overweight or have a high risk for diabetes. What should I know about preventing infection? Hepatitis B If you have a higher risk for hepatitis B, you should be screened for this virus. Talk with your health care provider to find out if you are at risk for hepatitis B infection. Hepatitis C Blood testing is recommended for:  Everyone born from [birth year] through 1966.  Anyone with known risk factors for hepatitis C. Sexually transmitted infections (STIs)  You should be screened each year for STIs, including gonorrhea and chlamydia, if: ? You are sexually active and are younger than 50 years of age. ? You are older than 50 years of age and your health care provider tells you that you are at risk for this type of infection. ? Your sexual activity has changed since you were last screened, and you are at increased risk for chlamydia or gonorrhea. Ask your health care provider if you are at  risk.  Ask your health care provider about whether you are at high risk for HIV. Your health care provider may recommend a prescription medicine to help prevent HIV infection. If you choose to take medicine to prevent HIV, you should first get tested for HIV. You should then be tested every 3 months for as long as you are taking the medicine. Follow these instructions at home: Lifestyle  Do not use any products that contain nicotine or tobacco, such as cigarettes, e-cigarettes, and chewing tobacco. If you need help quitting, ask your health care provider.  Do not use street drugs.  Do not share needles.  Ask your health care provider for help if you need support or information about quitting drugs. Alcohol use  Do not drink alcohol if your health care  provider tells you not to drink.  If you drink alcohol: ? Limit how much you have to 0-2 drinks a day. ? Be aware of how much alcohol is in your drink. In the [place] one drink equals one 12 oz bottle of beer (355 mL), one 5 oz glass of wine (148 mL), or one 1 oz glass of hard liquor (44 mL). General instructions  Schedule regular health, dental, and eye exams.  Stay current with your vaccines.  Tell your health care provider if: ? You often feel depressed. ? You have ever been abused or do not feel safe at home. Summary  Adopting a healthy lifestyle and getting preventive care are important in promoting health and wellness.  Follow your health care provider's instructions about healthy diet, exercising, and getting tested or screened for diseases.  Follow your health care provider's instructions on monitoring your cholesterol and blood pressure. This information is not intended to replace advice given to you by your health care provider. Make sure you discuss any questions you have with your health care provider. Document Revised: 09/29/2018 Document Reviewed: 09/29/2018 Elsevier Patient Education  2020 [employer]  Preventive  Care 48-63 Years Old, Male Preventive care refers to lifestyle choices and visits with your health care provider that can promote health and wellness. This includes:  A yearly physical exam. This is also called an annual well check.  Regular dental and eye exams.  Immunizations.  Screening for certain conditions.  Healthy lifestyle choices, such as eating a healthy diet, getting regular exercise, not using drugs or products that contain nicotine and tobacco, and limiting alcohol use. What can I expect for my preventive care visit? Physical exam Your health care provider will check:  Height and weight. These may be used to calculate body mass index (BMI), which is a measurement that tells if you are at a healthy weight.  Heart rate and blood pressure.  Your skin for abnormal spots. Counseling Your health care provider may ask you questions about:  Alcohol, tobacco, and drug use.  Emotional well-being.  Home and relationship well-being.  Sexual activity.  Eating habits.  Work and work Statistician. What immunizations do I need?  Influenza (flu) vaccine  This is recommended every year. Tetanus, diphtheria, and pertussis (Tdap) vaccine  You may need a Td booster every 10 years. Varicella (chickenpox) vaccine  You may need this vaccine if you have not already been vaccinated. Zoster (shingles) vaccine  You may need this after age 39. Measles, mumps, and rubella (MMR) vaccine  You may need at least one dose of MMR if you were born in 1957 or later. You may also need a second dose. Pneumococcal conjugate (PCV13) vaccine  You may need this if you have certain conditions and were not previously vaccinated. Pneumococcal polysaccharide (PPSV23) vaccine  You may need one or two doses if you smoke cigarettes or if you have certain conditions. Meningococcal conjugate (MenACWY) vaccine  You may need this if you have certain conditions. Hepatitis A vaccine  You may need  this if you have certain conditions or if you travel or work in places where you may be exposed to hepatitis A. Hepatitis B vaccine  You may need this if you have certain conditions or if you travel or work in places where you may be exposed to hepatitis B. Haemophilus influenzae type b (Hib) vaccine  You may need this if you have certain risk factors. Human papillomavirus (HPV) vaccine  If recommended by your health  care provider, you may need three doses over 6 months. You may receive vaccines as individual doses or as more than one vaccine together in one shot (combination vaccines). Talk with your health care provider about the risks and benefits of combination vaccines. What tests do I need? Blood tests  Lipid and cholesterol levels. These may be checked every 5 years, or more frequently if you are over 2 years old.  Hepatitis C test.  Hepatitis B test. Screening  Lung cancer screening. You may have this screening every year starting at age 59 if you have a 30-pack-year history of smoking and currently smoke or have quit within the past 15 years.  Prostate cancer screening. Recommendations will vary depending on your family history and other risks.  Colorectal cancer screening. All adults should have this screening starting at age 67 and continuing until age 38. Your health care provider may recommend screening at age 15 if you are at increased risk. You will have tests every 1-10 years, depending on your results and the type of screening test.  Diabetes screening. This is done by checking your blood sugar (glucose) after you have not eaten for a while (fasting). You may have this done every 1-3 years.  Sexually transmitted disease (STD) testing. Follow these instructions at home: Eating and drinking  Eat a diet that includes fresh fruits and vegetables, whole grains, lean protein, and low-fat dairy products.  Take vitamin and mineral supplements as recommended by your health  care provider.  Do not drink alcohol if your health care provider tells you not to drink.  If you drink alcohol: ? Limit how much you have to 0-2 drinks a day. ? Be aware of how much alcohol is in your drink. In the [place] one drink equals one 12 oz bottle of beer (355 mL), one 5 oz glass of wine (148 mL), or one 1 oz glass of hard liquor (44 mL). Lifestyle  Take daily care of your teeth and gums.  Stay active. Exercise for at least 30 minutes on 5 or more days each week.  Do not use any products that contain nicotine or tobacco, such as cigarettes, e-cigarettes, and chewing tobacco. If you need help quitting, ask your health care provider.  If you are sexually active, practice safe sex. Use a condom or other form of protection to prevent STIs (sexually transmitted infections).  Talk with your health care provider about taking a low-dose aspirin every day starting at age 60. What's next?  Go to your health care provider once a year for a well check visit.  Ask your health care provider how often you should have your eyes and teeth checked.  Stay up to date on all vaccines. This information is not intended to replace advice given to you by your health care provider. Make sure you discuss any questions you have with your health care provider. Document Revised: 09/30/2018 Document Reviewed: 09/30/2018 Elsevier Patient Education  2020 [employer].

## 2020-01-13 NOTE — Progress Notes (Signed)
Established Patient Office Visit  Subjective:  Patient ID: Joseph Horton, male    DOB: 09-07-1971  Age: 49 y.o. MRN: [id number]  CC:  Chief Complaint  Patient presents with  . Annual Exam    CPE, left knee pain sometimes    HPI Joseph Horton presents for for physical and follow-up of his diabetes fatty liver disease and left knee pain.  Continues Janumet and lisinopril for his diabetes and hypertension.  Weight is been stable.  He is walking for exercise.  He did see the eye doctor back in January.  He has been having some left knee discomfort.  There is tightness when he flexes it.  Denies any injury history.  It does not lock up or give way.  Continue seeing cardiology for statin intolerance with elevated LDL cholesterol history of coronary artery disease.  Continues taking Praluent per contact cardiology  Past Medical History:  Diagnosis Date  . History of colon polyps   . Hyperlipidemia   . Myocardial infarction ([place])   . Neuromuscular disorder ([place])    Bell's palsy    Past Surgical History:  Procedure Laterality Date  . CARDIAC CATHETERIZATION  05/05/2014   stents placed  . COLONOSCOPY     2018    Family History  Problem Relation Age of Onset  . Asthma Mother   . Hyperlipidemia Mother   . Colon polyps Mother   . Heart disease Father   . Esophageal cancer Neg Hx   . Colon cancer Neg Hx   . Rectal cancer Neg Hx   . Stomach cancer Neg Hx     Social History   Socioeconomic History  . Marital status: Significant Other    Spouse name: Not on file  . Number of children: Not on file  . Years of education: Not on file  . Highest education level: Not on file  Occupational History  . Not on file  Tobacco Use  . Smoking status: Never Smoker  . Smokeless tobacco: Never Used  Substance and Sexual Activity  . Alcohol use: Yes    Comment: 2 glasses of wine per week.  . Drug use: Never  . Sexual activity: Not on file  Other Topics Concern  . Not on file  Social  History Narrative  . Not on file   Social Determinants of Health   Financial Resource Strain:   . Difficulty of Paying Living Expenses:   Food Insecurity:   . Worried About Charity fundraiser in the Last Year:   . Arboriculturist in the Last Year:   Transportation Needs:   . Film/video editor (Medical):   Marland Kitchen Lack of Transportation (Non-Medical):   Physical Activity:   . Days of Exercise per Week:   . Minutes of Exercise per Session:   Stress:   . Feeling of Stress :   Social Connections:   . Frequency of Communication with Friends and Family:   . Frequency of Social Gatherings with Friends and Family:   . Attends Religious Services:   . Active Member of Clubs or Organizations:   . Attends Archivist Meetings:   Marland Kitchen Marital Status:   Intimate Partner Violence:   . Fear of Current or Ex-Partner:   . Emotionally Abused:   Marland Kitchen Physically Abused:   . Sexually Abused:     Outpatient Medications Prior to Visit  Medication Sig Dispense Refill  . aspirin 81 MG tablet Take 1 tablet by mouth daily.    Marland Kitchen  Evolocumab (REPATHA SURECLICK) XX123456 MG/ML SOAJ Inject 1 Syringe into the skin every 14 (fourteen) days.    Marland Kitchen JANUMET XR 50-1000 MG TB24 Take 1 tablet by mouth once daily 90 tablet 0  . lisinopril (PRINIVIL,ZESTRIL) 10 MG tablet Take 10 mg by mouth daily.    . metoprolol succinate (TOPROL-XL) 25 MG 24 hr tablet Take 1 tablet by mouth daily.    . nitroGLYCERIN (NITROSTAT) 0.4 MG SL tablet Place 1 tablet under the tongue every 5 minutes as needed for up to 30 days for chest pain.    . Omega-3 1000 MG CAPS Take 1 capsule by mouth daily.    . Alirocumab (PRALUENT) 75 MG/ML SOAJ Inject 1 mL (75 mg total) into the skin every 14 days.     No facility-administered medications prior to visit.    Allergies  Allergen Reactions  . Crab [Shellfish Allergy]     Blue crab  . Rosuvastatin Other (See Comments)    myalgia myalgia   . Statins Other (See Comments)    ROS Review of  Systems  Constitutional: Negative.   HENT: Negative.   Eyes: Negative for photophobia and visual disturbance.  Respiratory: Negative.   Cardiovascular: Negative.   Gastrointestinal: Negative.   Endocrine: Negative for polyphagia and polyuria.  Genitourinary: Negative for difficulty urinating, frequency and urgency.  Musculoskeletal: Positive for arthralgias. Negative for myalgias.  Skin: Negative for pallor and rash.  Allergic/Immunologic: Negative for immunocompromised state.  Neurological: Negative for tremors and speech difficulty.  Hematological: Does not bruise/bleed easily.  Psychiatric/Behavioral: Negative.    Depression screen PHQ 2/9 01/13/2020  Decreased Interest 0  Down, Depressed, Hopeless 0  PHQ - 2 Score 0      Objective:    Physical Exam  Constitutional: He is oriented to person, place, and time. He appears well-developed and well-nourished. No distress.  HENT:  Head: Normocephalic and atraumatic.  Right Ear: External ear normal.  Left Ear: External ear normal.  Mouth/Throat: Oropharynx is clear and moist. No oropharyngeal exudate.  Eyes: Conjunctivae are normal. Right eye exhibits no discharge. Left eye exhibits no discharge. No scleral icterus.  Neck: No JVD present. No tracheal deviation present. No thyromegaly present.  Cardiovascular: Normal rate, regular rhythm and normal heart sounds.  Pulmonary/Chest: Effort normal and breath sounds normal. No stridor.  Abdominal: Bowel sounds are normal. He exhibits no distension. There is no abdominal tenderness. There is no rebound and no guarding.  Genitourinary: Rectum:     External hemorrhoid present.     No rectal mass, anal fissure, tenderness, internal hemorrhoid or abnormal anal tone.  Prostate is not enlarged and not tender.  Musculoskeletal:     Left knee: Effusion present. No swelling. Normal range of motion. No tenderness. No medial joint line or lateral joint line tenderness.        Legs:  Lymphadenopathy:    He has no cervical adenopathy.  Neurological: He is alert and oriented to person, place, and time.  Skin: Skin is warm and dry. He is not diaphoretic.  Psychiatric: He has a normal mood and affect. His behavior is normal.    BP 112/76   Pulse 86   Temp (!) 97.3 F (36.3 C) (Tympanic)   Ht 5\' 4"  (1.626 m)   Wt 163 lb 6.4 oz (74.1 kg)   SpO2 96%   BMI 28.05 kg/m  Wt Readings from Last 3 Encounters:  01/13/20 163 lb 6.4 oz (74.1 kg)  09/30/19 163 lb (73.9 kg)  09/20/19 163  lb (73.9 kg)   Diabetic Foot Exam - Simple   Simple Foot Form Diabetic Foot exam was performed with the following findings: Yes 01/13/2020  8:50 AM  Visual Inspection No deformities, no ulcerations, no other skin breakdown bilaterally: Yes Sensation Testing Intact to touch and monofilament testing bilaterally: Yes Pulse Check Posterior Tibialis and Dorsalis pulse intact bilaterally: Yes Comments     Health Maintenance Due  Topic Date Due  . HIV Screening  Never done  . TETANUS/TDAP  Never done    There are no preventive care reminders to display for this patient.  No results found for: TSH Lab Results  Component Value Date   WBC 6.9 09/08/2019   HGB 16.1 09/08/2019   HCT 46.4 09/08/2019   MCV 96.4 09/08/2019   PLT 208.0 09/08/2019   Lab Results  Component Value Date   NA 136 09/08/2019   K 4.7 09/08/2019   CO2 26 09/08/2019   GLUCOSE 123 (H) 09/08/2019   BUN 15 09/08/2019   CREATININE 1.12 09/08/2019   BILITOT 0.5 09/08/2019   ALKPHOS 90 09/08/2019   AST 22 09/08/2019   ALT 45 09/08/2019   PROT 7.0 09/08/2019   ALBUMIN 4.4 09/08/2019   CALCIUM 9.1 09/08/2019   GFR 69.46 09/08/2019   Lab Results  Component Value Date   CHOL 173 09/08/2019   Lab Results  Component Value Date   HDL 43.70 09/08/2019   Lab Results  Component Value Date   LDLCALC 96 09/08/2019   Lab Results  Component Value Date   TRIG 168.0 (H) 09/08/2019   Lab Results   Component Value Date   CHOLHDL 4 09/08/2019   Lab Results  Component Value Date   HGBA1C 6.3 09/08/2019      Assessment & Plan:   Problem List Items Addressed This Visit      Digestive   Fatty liver disease, nonalcoholic   Relevant Orders   CBC   Comprehensive metabolic panel   Gamma GT     Endocrine   Type 2 diabetes mellitus without complication, without long-term current use of insulin (HCC) - Primary   Relevant Orders   Hemoglobin A1c   Urinalysis, Routine w reflex microscopic     Other   Left knee pain   Relevant Orders   DG Knee Complete 4 Views Left      No orders of the defined types were placed in this encounter.   Follow-up: Return in about 6 months (around 07/15/2020), or let me know if knee worsens..  Patient was given information on health maintenance and disease prevention as well as mindfulness based stress reduction.  Has been having some more issues with trigger finger.  We discussed referral to hand doctor.  He will let me know of the knee continues to gather fluid.  I am hoping that the small effusion he has at present will resolve.  Exam of the knee was otherwise normal.  I suggested a elastic sleeve.   Libby Maw, MD

## 2020-01-17 NOTE — Telephone Encounter (Signed)
Please schedule for a virtual.

## 2020-01-17 NOTE — Telephone Encounter (Signed)
Patient is calling back to check the status of a message he left via mychart. CB is [phone]

## 2020-02-01 ENCOUNTER — Telehealth (INDEPENDENT_AMBULATORY_CARE_PROVIDER_SITE_OTHER): Payer: BC Managed Care – PPO | Admitting: Family Medicine

## 2020-02-01 ENCOUNTER — Encounter: Payer: Self-pay | Admitting: Family Medicine

## 2020-02-01 VITALS — Temp 100.1°F | Ht 64.0 in | Wt 158.0 lb

## 2020-02-01 DIAGNOSIS — R748 Abnormal levels of other serum enzymes: Secondary | ICD-10-CM | POA: Diagnosis not present

## 2020-02-01 DIAGNOSIS — R932 Abnormal findings on diagnostic imaging of liver and biliary tract: Secondary | ICD-10-CM | POA: Diagnosis not present

## 2020-02-01 NOTE — Progress Notes (Signed)
Established Patient Office Visit  Subjective:  Patient ID: Joseph Horton, male    DOB: Nov 27, 1970  Age: 49 y.o. MRN: [id number]  CC:  Chief Complaint  Patient presents with  . Follow-up    discuss labs    HPI Joseph Horton presents for follow-up of elevated GGT and mildly elevated potassium.  AST and ALT were normal.  AST and ALT were normal.  Triglycerides were 168.  Patient consumes no more than 2 alcoholic drinks weekly.  He has never been jaundiced.  CT from 2019 did show surface irregularity in the lateral lobe with evidence for small lesion.  Past Medical History:  Diagnosis Date  . History of colon polyps   . Hyperlipidemia   . Myocardial infarction ([place])   . Neuromuscular disorder ([place])    Bell's palsy    Past Surgical History:  Procedure Laterality Date  . CARDIAC CATHETERIZATION  05/05/2014   stents placed  . COLONOSCOPY     2018    Family History  Problem Relation Age of Onset  . Asthma Mother   . Hyperlipidemia Mother   . Colon polyps Mother   . Heart disease Father   . Esophageal cancer Neg Hx   . Colon cancer Neg Hx   . Rectal cancer Neg Hx   . Stomach cancer Neg Hx     Social History   Socioeconomic History  . Marital status: Significant Other    Spouse name: Not on file  . Number of children: Not on file  . Years of education: Not on file  . Highest education level: Not on file  Occupational History  . Not on file  Tobacco Use  . Smoking status: Never Smoker  . Smokeless tobacco: Never Used  Substance and Sexual Activity  . Alcohol use: Yes    Comment: 2 glasses of wine per week.  . Drug use: Never  . Sexual activity: Not on file  Other Topics Concern  . Not on file  Social History Narrative  . Not on file   Social Determinants of Health   Financial Resource Strain:   . Difficulty of Paying Living Expenses:   Food Insecurity:   . Worried About Charity fundraiser in the Last Year:   . Arboriculturist in the Last Year:     Transportation Needs:   . Film/video editor (Medical):   Marland Kitchen Lack of Transportation (Non-Medical):   Physical Activity:   . Days of Exercise per Week:   . Minutes of Exercise per Session:   Stress:   . Feeling of Stress :   Social Connections:   . Frequency of Communication with Friends and Family:   . Frequency of Social Gatherings with Friends and Family:   . Attends Religious Services:   . Active Member of Clubs or Organizations:   . Attends Archivist Meetings:   Marland Kitchen Marital Status:   Intimate Partner Violence:   . Fear of Current or Ex-Partner:   . Emotionally Abused:   Marland Kitchen Physically Abused:   . Sexually Abused:     Outpatient Medications Prior to Visit  Medication Sig Dispense Refill  . aspirin 81 MG tablet Take 1 tablet by mouth daily.    . Evolocumab (REPATHA SURECLICK) XX123456 MG/ML SOAJ Inject 1 Syringe into the skin every 14 (fourteen) days.    Marland Kitchen JANUMET XR 50-1000 MG TB24 Take 1 tablet by mouth once daily 90 tablet 0  . lisinopril (PRINIVIL,ZESTRIL) 10 MG tablet Take  10 mg by mouth daily.    . metoprolol succinate (TOPROL-XL) 25 MG 24 hr tablet Take 1 tablet by mouth daily.    . nitroGLYCERIN (NITROSTAT) 0.4 MG SL tablet Place 1 tablet under the tongue every 5 minutes as needed for up to 30 days for chest pain.    . Omega-3 1000 MG CAPS Take 1 capsule by mouth daily.    . Alirocumab (PRALUENT) 75 MG/ML SOAJ Inject 1 mL (75 mg total) into the skin every 14 days.     No facility-administered medications prior to visit.    Allergies  Allergen Reactions  . Crab [Shellfish Allergy]     Blue crab  . Rosuvastatin Other (See Comments)    myalgia myalgia   . Statins Other (See Comments)    ROS Review of Systems  Constitutional: Negative.   HENT: Negative.   Eyes: Negative for photophobia and visual disturbance.  Respiratory: Negative.   Cardiovascular: Negative.   Gastrointestinal: Negative for abdominal pain, anal bleeding, blood in stool and  diarrhea.  Genitourinary: Negative.   Psychiatric/Behavioral: Negative.       Objective:    Physical Exam  Constitutional: He is oriented to person, place, and time. No distress.  Pulmonary/Chest: Effort normal.  Neurological: He is alert and oriented to person, place, and time.  Psychiatric: He has a normal mood and affect. His behavior is normal.    Temp 100.1 F (37.8 C) (Tympanic)   Ht 5\' 4"  (1.626 m)   Wt 158 lb (71.7 kg) Comment: per patient  BMI 27.12 kg/m  Wt Readings from Last 3 Encounters:  02/01/20 158 lb (71.7 kg)  01/13/20 163 lb 6.4 oz (74.1 kg)  09/30/19 163 lb (73.9 kg)     Health Maintenance Due  Topic Date Due  . HIV Screening  Never done  . TETANUS/TDAP  Never done    There are no preventive care reminders to display for this patient.  No results found for: TSH Lab Results  Component Value Date   WBC 6.6 01/13/2020   HGB 16.3 01/13/2020   HCT 47.7 01/13/2020   MCV 96.0 01/13/2020   PLT 241.0 01/13/2020   Lab Results  Component Value Date   NA 137 01/13/2020   K 5.2 No hemolysis seen (H) 01/13/2020   CO2 29 01/13/2020   GLUCOSE 129 (H) 01/13/2020   BUN 13 01/13/2020   CREATININE 1.10 01/13/2020   BILITOT 0.7 01/13/2020   ALKPHOS 85 01/13/2020   AST 21 01/13/2020   ALT 30 01/13/2020   PROT 7.3 01/13/2020   ALBUMIN 4.6 01/13/2020   CALCIUM 9.8 01/13/2020   GFR 70.82 01/13/2020   Lab Results  Component Value Date   CHOL 173 09/08/2019   Lab Results  Component Value Date   HDL 43.70 09/08/2019   Lab Results  Component Value Date   LDLCALC 96 09/08/2019   Lab Results  Component Value Date   TRIG 168.0 (H) 09/08/2019   Lab Results  Component Value Date   CHOLHDL 4 09/08/2019   Lab Results  Component Value Date   HGBA1C 6.3 01/13/2020      Assessment & Plan:   Problem List Items Addressed This Visit      Other   Elevated serum GGT level - Primary   Relevant Orders   Gamma GT   Hepatic function panel   Basic  metabolic panel   Ambulatory referral to Gastroenterology   Abnormal CT of liver   Relevant Orders   MR  Abdomen W Wo Contrast   Ambulatory referral to Gastroenterology      No orders of the defined types were placed in this encounter.   Follow-up: Return if symptoms worsen or fail to improve.    Libby Maw, MD   Virtual Visit via Video Note  I connected with [name] on 02/01/20 at  8:30 AM EDT by a video enabled telemedicine application and verified that I am speaking with the correct person using two identifiers.  Location: Patient: work in office alone. Provider:   I discussed the limitations of evaluation and management by telemedicine and the availability of in person appointments. The patient expressed understanding and agreed to proceed.  History of Present Illness:    Observations/Objective:   Assessment and Plan:   Follow Up Instructions:    I discussed the assessment and treatment plan with the patient. The patient was provided an opportunity to ask questions and all were answered. The patient agreed with the plan and demonstrated an understanding of the instructions.   The patient was advised to call back or seek an in-person evaluation if the symptoms worsen or if the condition fails to improve as anticipated.  I provided 20 minutes of non-face-to-face time during this encounter.   Libby Maw, MD   Virtual Visit via Telephone Note  I connected with [name] on 02/01/20 at  8:30 AM EDT by telephone and verified that I am speaking with the correct person using two identifiers.  Libby Maw, MD   Interactive video and audio telecommunications were attempted between myself and the patient. However they failed due to the patient having technical difficulties or not having access to video capability. We continued and completed with audio only.

## 2020-02-02 ENCOUNTER — Other Ambulatory Visit: Payer: Self-pay

## 2020-02-03 ENCOUNTER — Other Ambulatory Visit (INDEPENDENT_AMBULATORY_CARE_PROVIDER_SITE_OTHER): Payer: BC Managed Care – PPO

## 2020-02-03 DIAGNOSIS — R748 Abnormal levels of other serum enzymes: Secondary | ICD-10-CM | POA: Diagnosis not present

## 2020-02-03 LAB — HEPATIC FUNCTION PANEL
ALT: 47 U/L (ref 0–53)
AST: 29 U/L (ref 0–37)
Albumin: 4.5 g/dL (ref 3.5–5.2)
Alkaline Phosphatase: 94 U/L (ref 39–117)
Bilirubin, Direct: 0.1 mg/dL (ref 0.0–0.3)
Total Bilirubin: 0.6 mg/dL (ref 0.2–1.2)
Total Protein: 7.3 g/dL (ref 6.0–8.3)

## 2020-02-03 LAB — BASIC METABOLIC PANEL
BUN: 11 mg/dL (ref 6–23)
CO2: 29 mEq/L (ref 19–32)
Calcium: 9.2 mg/dL (ref 8.4–10.5)
Chloride: 102 mEq/L (ref 96–112)
Creatinine, Ser: 1.09 mg/dL (ref 0.40–1.50)
GFR: 71.56 mL/min (ref >60.00)
Glucose, Bld: 140 mg/dL — ABNORMAL HIGH (ref 70–99)
Potassium: 4.7 mEq/L (ref 3.5–5.1)
Sodium: 138 mEq/L (ref 135–145)

## 2020-02-03 LAB — GAMMA GT: GGT: 195 U/L — ABNORMAL HIGH (ref 7–51)

## 2020-02-06 ENCOUNTER — Telehealth: Payer: Self-pay | Admitting: Family Medicine

## 2020-02-06 NOTE — Telephone Encounter (Signed)
error 

## 2020-02-06 NOTE — Telephone Encounter (Signed)
Pt called and said that May the 14th was the earliest they could get him in but he said it needs to be done sooner and wanted to know if there is anywhere else that could get him in sooner. Please advise

## 2020-02-07 NOTE — Telephone Encounter (Signed)
LM for pt to call [facility] for fastest option (ph # [phone]). If they can schedule sooner I advised pt to let me know so I can change the auth to that office for MRI.

## 2020-02-07 NOTE — Telephone Encounter (Signed)
Spoke to pt - he is scheduled 5/3 at [facility] for MRI - updated facility for authorization and pt notified

## 2020-02-08 ENCOUNTER — Telehealth: Payer: Self-pay | Admitting: Family Medicine

## 2020-02-08 NOTE — Telephone Encounter (Signed)
Hello Joseph Horton, is this what Dr. Ethelene Hal signed earlier today? MRI order

## 2020-02-08 NOTE — Telephone Encounter (Signed)
I just called pt again - he states that his insurance is working with him to find the most cost effective and soonest appt. The insurance company is seeing if they can get pt scheduled with [facility]. Pt is to hear from them tomorrow morning and will call back and notify which facility he is going to have the MRI completed. I have the signed order - waiting for notification of which facility pt has finalized going to.

## 2020-02-08 NOTE — Telephone Encounter (Signed)
Okay thank you

## 2020-02-08 NOTE — Telephone Encounter (Signed)
Please fax copy of MRI order to [facility], fax [phone].

## 2020-02-17 DIAGNOSIS — K7689 Other specified diseases of liver: Secondary | ICD-10-CM | POA: Diagnosis not present

## 2020-02-20 ENCOUNTER — Other Ambulatory Visit: Payer: BC Managed Care – PPO

## 2020-02-28 ENCOUNTER — Other Ambulatory Visit: Payer: Self-pay | Admitting: Family Medicine

## 2020-02-28 ENCOUNTER — Ambulatory Visit: Payer: BC Managed Care – PPO | Admitting: Gastroenterology

## 2020-02-28 ENCOUNTER — Encounter: Payer: Self-pay | Admitting: Gastroenterology

## 2020-02-28 ENCOUNTER — Other Ambulatory Visit: Payer: Self-pay

## 2020-02-28 VITALS — BP 118/70 | HR 65 | Temp 97.3°F | Ht 64.0 in | Wt 162.2 lb

## 2020-02-28 DIAGNOSIS — K7689 Other specified diseases of liver: Secondary | ICD-10-CM

## 2020-02-28 DIAGNOSIS — R748 Abnormal levels of other serum enzymes: Secondary | ICD-10-CM | POA: Diagnosis not present

## 2020-02-28 DIAGNOSIS — K219 Gastro-esophageal reflux disease without esophagitis: Secondary | ICD-10-CM

## 2020-02-28 DIAGNOSIS — R7401 Elevation of levels of liver transaminase levels: Secondary | ICD-10-CM

## 2020-02-28 DIAGNOSIS — E119 Type 2 diabetes mellitus without complications: Secondary | ICD-10-CM

## 2020-02-28 NOTE — Progress Notes (Signed)
P  Chief Complaint:      GI History: Joseph Horton a 49 y.o.malewith a hx of CAD w/ MI in 2015 s/p PCI w/ 2 stentsplaced in right coronary artery, DM (A1c 6.4), HLD, who follows in the GI clinic for the following:  1) History of colon polyps: Colonoscopy was in 2018 with [facility] which was notable for polyps, with recommendation to repeat in 2 years (unclear reason; requesting records today).Otherwise, he is without lower GI symptoms to include no hematochezia, change in bowel habits, abdominal pain -Colonoscopy (12/202, Dr. Bryan Lemma): 2 polyps, 3-4 mm (path: TA x1, HP x1),internal hemorhroids. Repeat 5 years  2) Fatty liver disease/elevated ALT: Previously evaluated by [facility] 2019, to include CT as outlined below.  Very mildly elevated liver enzymes in 2018, with reported improvement in 2019 (records requested, not received).  Was previously drinking approximately 10 drinks/week (wine/beer), but stopped since being told about liver issues.  Improved BMI from 29 to 28 with increased exercise/activity and healthy eating.  -04/2017 with AST/ALT 33/45, ALP 79, T bili 0.8 with normal albumin -08/2019 AST/ALT 22/45, ALP 90 -CT abdomen (10/2017) with?fineliver surface irregularity and slight hypertrophy of lateral left lobe, unable to exclude early cirrhosis. 0.5 cm hypodensity in the inferior right liver lobe. Normal ducts, normal pancreas, spleen. Radiologist recommended hepatic elastography along with MRI three-phase in 3 to 6 months for the small hypodensity. -Interestingly, he even sought a 2nd opinion with another GI in [place](where he is from)in 2019, to include MRI liver: -01/11/2018, MRI liver: 5 mm hepatic cyst in segment 6, with otherwise normal intrahepatic ducts.  Normal CBD at 4 mm.  No masses. Otherwise normal liver size, contour. -01/13/20: AST/ALT 21/30, normal ALP, TBili. GGT 149 -02/03/20: AST/ALT 29/47, normal ALP, TBili. GGT 195 -02/07/2020, MRI abdomen:  6 mm benign appearing cyst anterior lateral right hepatic lobe, otherwise normal liver.  Tiny bilateral simple renal cysts.  Otherwise normal   HPI:     Patient is a 49 y.o. male presenting to the Gastroenterology Clinic for follow-up.  Initially seen by me on 09/20/2019 (telemedicine) for evaluation of elevated ALT, fatty liver disease, previous abnormal liver imaging, and colon polyps.  Since then, completed colonoscopy, repeat labs, and repeat MRI liver as above.    Today, reports he otherwise feels well.  Drinks 1-2 glasses of wine and 5 beers per week.   He does endorse new and progressively worsening reflux symptoms.  Symptoms have been mild an intermittent for a couple years or so, but more recently on a regular and more bothersome.  Index symptoms of heartburn, regurgitation.  Worse with spicy foods.  Has not trialed any medications.  No dysphagia or odynophagia or weight loss.  No previous EGD.  He is increasingly more concerned about the symptoms.    Review of systems:     No chest pain, no SOB, no fevers, no urinary sx   Past Medical History:  Diagnosis Date  . History of colon polyps   . Hyperlipidemia   . Myocardial infarction ([place])   . Neuromuscular disorder ([place])    Bell's palsy    Patient's surgical history, family medical history, social history, medications and allergies were all reviewed in Epic    Current Outpatient Medications  Medication Sig Dispense Refill  . aspirin 81 MG tablet Take 1 tablet by mouth daily.    . Evolocumab (REPATHA SURECLICK) [id number] MG/ML SOAJ Inject 1 Syringe into the skin every 14 (fourteen) days.    Marland Kitchen  JANUMET XR 50-1000 MG TB24 Take 1 tablet by mouth once daily 90 tablet 1  . lisinopril (PRINIVIL,ZESTRIL) 10 MG tablet Take 10 mg by mouth daily.    . metoprolol succinate (TOPROL-XL) 25 MG 24 hr tablet Take 1 tablet by mouth daily.    . nitroGLYCERIN (NITROSTAT) 0.4 MG SL tablet Place 1 tablet under the tongue every 5 minutes as needed for  up to 30 days for chest pain.    . Omega-3 1000 MG CAPS Take 1 capsule by mouth daily.     No current facility-administered medications for this visit.    Physical Exam:     BP 118/70   Pulse 65   Temp (!) 97.3 F (36.3 C)   Ht 5\' 4"  (D34-534 m)   Wt 162 lb 4 oz (73.6 kg)   BMI 27.85 kg/m   GENERAL:  Pleasant male in NAD PSYCH: : Cooperative, normal affect EENT:  conjunctiva pink, mucous membranes moist, neck supple without masses CARDIAC:  RRR, no murmur heard, no peripheral edema PULM: Normal respiratory effort, lungs CTA bilaterally, no wheezing ABDOMEN:  Nondistended, soft, nontender. No obvious masses, no hepatomegaly,  normal bowel sounds SKIN:  turgor, no lesions seen Musculoskeletal:  Normal muscle tone, normal strength NEURO: Alert and oriented x 3, no focal neurologic deficits   IMPRESSION and PLAN:    1) GERD Progressively worsening reflux symptoms which are now more frequent and worse in severity.  Discussed current societal guidelines, and given age, increasing symptoms, male, guideline recommendations are for EGD for Barrett's Esophagus screening - EGD for BE screening -Can evaluate for erosive esophagitis, LES laxity, hiatal hernia time of EGD -Discussed trial of acid suppression therapy, but opts for endoscopic evaluation first -Continue avoidance of exacerbating foods  2) Hepatic Cyst: -Benign-appearing hepatic cyst which is largely stable in size (5 mm vs 6 mm) on serial imaging studies -No further work-up needed  3) Elevated ALT - Mildly elevated ALT (47) with otherwise normal AST, T bili, ALP.  Liver otherwise largely normal-appearing on MRI x2 -Discussed possible early fatty liver infiltration.  Recommended regular exercise/activity, healthy eating -Can repeat liver enzymes in 1 year  4) Elevated GGT -Isolated elevation of GGT with otherwise normal T bili and ALP.  Normal CBD and intrahepatic ducts on MRI x2. -Discussed incidentally elevated GGT  without corresponding elevation in other labs or concerning findings on MRI.  Consumes 7 or less drinks per week. -There are data to show isolated elevation of GGT in random patients with otherwise normal ALP and T bili corresponding to hepatobiliary disease in only 32% -Do not think further testing necessary at this time  The indications, risks, and benefits of EGD were explained to the patient in detail. Risks include but are not limited to bleeding, perforation, adverse reaction to medications, and cardiopulmonary compromise. Sequelae include but are not limited to the possibility of surgery, hositalization, and mortality. The patient verbalized understanding and wished to proceed. All questions answered, referred to scheduler. Further recommendations pending results of the exam.            Lavena Bullion ,DO, FACG 02/28/2020, 3:24 PM

## 2020-02-28 NOTE — Patient Instructions (Addendum)
If you are age 50 or older, your body mass index should be between 23-30. Your Body mass index is 27.85 kg/m. If this is out of the aforementioned range listed, please consider follow up with your Primary Care Provider.  If you are age 68 or younger, your body mass index should be between 19-25. Your Body mass index is 27.85 kg/m. If this is out of the aformentioned range listed, please consider follow up with your Primary Care Provider.   You have been scheduled for an endoscopy. Please follow written instructions given to you at your visit today. If you use inhalers (even only as needed), please bring them with you on the day of your procedure. Your physician has requested that you go to www.startemmi.com and enter the access code given to you at your visit today. This web site gives a general overview about your procedure. However, you should still follow specific instructions given to you by our office regarding your preparation for the procedure.  It was a pleasure to see you today!  Vito Cirigliano, D.O.

## 2020-03-01 ENCOUNTER — Other Ambulatory Visit: Payer: BC Managed Care – PPO

## 2020-03-02 ENCOUNTER — Other Ambulatory Visit: Payer: BC Managed Care – PPO

## 2020-03-06 ENCOUNTER — Encounter: Payer: Self-pay | Admitting: Gastroenterology

## 2020-03-27 ENCOUNTER — Other Ambulatory Visit: Payer: Self-pay

## 2020-03-27 ENCOUNTER — Ambulatory Visit (AMBULATORY_SURGERY_CENTER): Payer: BC Managed Care – PPO | Admitting: Gastroenterology

## 2020-03-27 ENCOUNTER — Encounter: Payer: Self-pay | Admitting: Gastroenterology

## 2020-03-27 VITALS — BP 110/77 | HR 77 | Temp 96.8°F | Resp 15 | Ht 64.0 in | Wt 162.0 lb

## 2020-03-27 DIAGNOSIS — K2970 Gastritis, unspecified, without bleeding: Secondary | ICD-10-CM

## 2020-03-27 DIAGNOSIS — K317 Polyp of stomach and duodenum: Secondary | ICD-10-CM | POA: Diagnosis not present

## 2020-03-27 DIAGNOSIS — K2100 Gastro-esophageal reflux disease with esophagitis, without bleeding: Secondary | ICD-10-CM | POA: Diagnosis not present

## 2020-03-27 DIAGNOSIS — R0789 Other chest pain: Secondary | ICD-10-CM | POA: Diagnosis not present

## 2020-03-27 DIAGNOSIS — K269 Duodenal ulcer, unspecified as acute or chronic, without hemorrhage or perforation: Secondary | ICD-10-CM | POA: Diagnosis not present

## 2020-03-27 DIAGNOSIS — K3189 Other diseases of stomach and duodenum: Secondary | ICD-10-CM | POA: Diagnosis not present

## 2020-03-27 MED ORDER — SODIUM CHLORIDE 0.9 % IV SOLN: 500.0000 mL | Freq: Once | INTRAVENOUS | Status: DC

## 2020-03-27 MED ORDER — PANTOPRAZOLE SODIUM 40 MG PO TBEC
40.0000 mg | DELAYED_RELEASE_TABLET | Freq: Two times a day (BID) | ORAL | 3 refills | Status: DC
Start: 2020-03-27 — End: 2022-01-17

## 2020-03-27 NOTE — Op Note (Signed)
[facility] Patient Name: Joseph Horton Procedure Date: 03/27/2020 10:58 AM MRN: [id number] Endoscopist: Gerrit Heck , MD Age: 49 Referring MD:  Date of Birth: 01/29/1971 Gender: Male Account #: [id number] Procedure:                Upper GI endoscopy Indications:              Heartburn, Suspected esophageal reflux Medicines:                Monitored Anesthesia Care Procedure:                Pre-Anesthesia Assessment:                           - Prior to the procedure, a History and Physical                            was performed, and patient medications and                            allergies were reviewed. The patient's tolerance of                            previous anesthesia was also reviewed. The risks                            and benefits of the procedure and the sedation                            options and risks were discussed with the patient.                            All questions were answered, and informed consent                            was obtained. Prior Anticoagulants: The patient has                            taken no previous anticoagulant or antiplatelet                            agents. ASA Grade Assessment: III - A patient with                            severe systemic disease. After reviewing the risks                            and benefits, the patient was deemed in                            satisfactory condition to undergo the procedure.                           After obtaining informed consent, the endoscope was  passed under direct vision. Throughout the                            procedure, the patient's blood pressure, pulse, and                            oxygen saturations were monitored continuously. The                            Endoscope was introduced through the mouth, and                            advanced to the second part of duodenum. The upper                            GI endoscopy was  accomplished without difficulty.                            The patient tolerated the procedure well. Scope In: Scope Out: Findings:                 LA Grade A (one or more mucosal breaks less than 5                            mm, not extending between tops of 2 mucosal folds)                            esophagitis with no bleeding was found 36 cm from                            the incisors.                           The gastroesophageal flap valve was visualized                            endoscopically and classified as Hill Grade III                            (minimal fold, loose to endoscope, hiatal hernia                            likely).                           The upper third of the esophagus and middle third                            of the esophagus were normal.                           Multiple 2 to 5 mm sessile polyps with no bleeding  and no stigmata of recent bleeding were found in                            the gastric fundus and in the gastric body. Several                            of these polyps were removed with a cold biopsy                            forceps for histologic representative evaluation.                            Resection and retrieval were complete. Estimated                            blood loss was minimal.                           Scattered mild inflammation characterized by                            erythema was found in the gastric body and in the                            gastric antrum. There were a few small,                            non-bleeding erosions in the antrum. Several                            mucosal biopsies were taken with a cold forceps for                            Helicobacter pylori testing. Estimated blood loss                            was minimal.                           Three non-bleeding superficial duodenal ulcers were                            found in the duodenal bulb. The  largest lesion was                            3 mm in largest dimension. There was mild                            surrounding mucosal erythema. Biopsies were taken                            with a cold forceps for histology. Estimated blood  loss was minimal.                           The second portion of the duodenum was normal. Complications:            No immediate complications. Estimated Blood Loss:     Estimated blood loss was minimal. Impression:               - LA Grade A reflux esophagitis with no bleeding.                           - Gastroesophageal flap valve classified as Hill                            Grade III (minimal fold, loose to endoscope, hiatal                            hernia likely).                           - Normal upper third of esophagus and middle third                            of esophagus.                           - Multiple gastric polyps. Resected and retrieved.                           - Gastritis. Biopsied.                           - Non-bleeding duodenal ulcers. Biopsied.                           - Normal second portion of the duodenum. Recommendation:           - Patient has a contact number available for                            emergencies. The signs and symptoms of potential                            delayed complications were discussed with the                            patient. Return to normal activities tomorrow.                            Written discharge instructions were provided to the                            patient.                           - Resume previous diet.                           -  Continue present medications.                           - Await pathology results.                           - Use Protonix (pantoprazole) 40 mg PO BID for 6                            weeks to promote mucosal healing and for treatment                            of reflux symptoms, with plan to reduce to  40 mg                            daily then continue to titrate to the lowest                            effective dose.                           - Return to GI clinic in 2 months, or sooner as                            needed. Gerrit Heck, MD 03/27/2020 11:18:47 AM

## 2020-03-27 NOTE — Progress Notes (Signed)
Called to room to assist during endoscopic procedure.  Patient ID and intended procedure confirmed with present staff. Received instructions for my participation in the procedure from the performing physician.  

## 2020-03-27 NOTE — Patient Instructions (Signed)
Hand out given for gastritis.  Pick up Rx for protonix.  YOU HAD AN ENDOSCOPIC PROCEDURE TODAY AT [facility]:   Refer to the procedure report that was given to you for any specific questions about what was found during the examination.  If the procedure report does not answer your questions, please call your gastroenterologist to clarify.  If you requested that your care partner not be given the details of your procedure findings, then the procedure report has been included in a sealed envelope for you to review at your convenience later.  YOU SHOULD EXPECT: Some feelings of bloating in the abdomen. Passage of more gas than usual.  Walking can help get rid of the air that was put into your GI tract during the procedure and reduce the bloating. If you had a lower endoscopy (such as a colonoscopy or flexible sigmoidoscopy) you may notice spotting of blood in your stool or on the toilet paper. If you underwent a bowel prep for your procedure, you may not have a normal bowel movement for a few days.  Please Note:  You might notice some irritation and congestion in your nose or some drainage.  This is from the oxygen used during your procedure.  There is no need for concern and it should clear up in a day or so.  SYMPTOMS TO REPORT IMMEDIATELY:   Following upper endoscopy (EGD)  Vomiting of blood or coffee ground material  New chest pain or pain under the shoulder blades  Painful or persistently difficult swallowing  New shortness of breath  Fever of 100F or higher  Black, tarry-looking stools  For urgent or emergent issues, a gastroenterologist can be reached at any hour by calling [phone]. Do not use MyChart messaging for urgent concerns.    DIET:  We do recommend a small meal at first, but then you may proceed to your regular diet.  Drink plenty of fluids but you should avoid alcoholic beverages for 24 hours.  ACTIVITY:  You should plan to take it easy for the rest  of today and you should NOT DRIVE or use heavy machinery until tomorrow (because of the sedation medicines used during the test).    FOLLOW UP: Our staff will call the number listed on your records 48-72 hours following your procedure to check on you and address any questions or concerns that you may have regarding the information given to you following your procedure. If we do not reach you, we will leave a message.  We will attempt to reach you two times.  During this call, we will ask if you have developed any symptoms of COVID 19. If you develop any symptoms (ie: fever, flu-like symptoms, shortness of breath, cough etc.) before then, please call [phone].  If you test positive for Covid 19 in the 2 weeks post procedure, please call and report this information to [place].    If any biopsies were taken you will be contacted by phone or by letter within the next 1-3 weeks.  Please call us at [phone] if you have not heard about the biopsies in 3 weeks.    SIGNATURES/CONFIDENTIALITY: You and/or your care partner have signed paperwork which will be entered into your electronic medical record.  These signatures attest to the fact that that the information above on your After Visit Summary has been reviewed and is understood.  Full responsibility of the confidentiality of this discharge information lies with you and/or your care-partner.

## 2020-03-27 NOTE — Progress Notes (Signed)
To PACU< VSS. Report to [email or web address] 

## 2020-03-29 ENCOUNTER — Telehealth: Payer: Self-pay | Admitting: *Deleted

## 2020-03-29 NOTE — Telephone Encounter (Signed)
  Follow up Call-  Call back number 03/27/2020 09/30/2019  Post procedure Call Back phone  # [phone]  Permission to leave phone message Yes Yes     Patient questions:  Do you have a fever, pain , or abdominal swelling? No. Pain Score  0 *  Have you tolerated food without any problems? Yes.    Have you been able to return to your normal activities? Yes.    Do you have any questions about your discharge instructions: Diet   No. Medications  No. Follow up visit  No.  Do you have questions or concerns about your Care? No.  Actions: * If pain score is 4 or above: No action needed, pain <4.  1. Have you developed a fever since your procedure? NO  2.   Have you had an respiratory symptoms (SOB or cough) since your procedure? NO  3.   Have you tested positive for COVID 19 since your procedure NO  4.   Have you had any family members/close contacts diagnosed with the COVID 19 since your procedure?  NO   If yes to any of these questions please route to Joylene John, RN and Erenest Rasher, RN

## 2020-04-03 ENCOUNTER — Encounter: Payer: Self-pay | Admitting: Gastroenterology

## 2020-05-01 ENCOUNTER — Telehealth: Payer: Self-pay | Admitting: Family Medicine

## 2020-05-01 NOTE — Telephone Encounter (Signed)
Patient is calling and wanted do speak to someone regarding getting a referral to see an endocrinologist, please advise. CB is [phone]

## 2020-05-03 NOTE — Telephone Encounter (Signed)
Patient is returning the call. CB is [phone]

## 2020-05-03 NOTE — Telephone Encounter (Signed)
Returned patients call regarding referral, no answer LMTCB

## 2020-05-04 ENCOUNTER — Telehealth: Payer: Self-pay | Admitting: Family Medicine

## 2020-05-04 NOTE — Telephone Encounter (Signed)
Joseph Horton is calling to see if she can get a verbal order for patient to receive a Dexcom G6 Glucose Monitoring System. CB is [phone]

## 2020-05-07 ENCOUNTER — Encounter: Payer: Self-pay | Admitting: Family Medicine

## 2020-05-07 NOTE — Telephone Encounter (Signed)
Please schedule patient to see me

## 2020-05-07 NOTE — Telephone Encounter (Signed)
Below message from patient regarding referral to endocrinologist, is it okay to refer patient. Please advise.   Dr. Ethelene Hal, I would like to be referred to an endocrinologist due to my elevated glucose numbers. Right now my range is between 135 to 150 every day in the morning. Even I try to eat healthy,  exercises, etc.   Please, contact me as your earliest convenience   Thank you.   Joseph Horton

## 2020-05-07 NOTE — Telephone Encounter (Signed)
Patient is calling back regarding a referral. He said if you call him and he doesn't answer to try back more than once because he may be with a client. He states that it is very important that he gets this referral and he would like a call back today.

## 2020-05-08 NOTE — Telephone Encounter (Signed)
Per patient he will call to schedule an appointment

## 2020-05-09 ENCOUNTER — Other Ambulatory Visit: Payer: Self-pay

## 2020-05-09 ENCOUNTER — Encounter: Payer: Self-pay | Admitting: Family Medicine

## 2020-05-09 ENCOUNTER — Ambulatory Visit: Payer: BC Managed Care – PPO | Admitting: Family Medicine

## 2020-05-09 VITALS — BP 114/72 | HR 70 | Temp 97.3°F | Ht 64.0 in | Wt 163.0 lb

## 2020-05-09 DIAGNOSIS — D229 Melanocytic nevi, unspecified: Secondary | ICD-10-CM | POA: Diagnosis not present

## 2020-05-09 DIAGNOSIS — R748 Abnormal levels of other serum enzymes: Secondary | ICD-10-CM

## 2020-05-09 DIAGNOSIS — R932 Abnormal findings on diagnostic imaging of liver and biliary tract: Secondary | ICD-10-CM

## 2020-05-09 DIAGNOSIS — E119 Type 2 diabetes mellitus without complications: Secondary | ICD-10-CM

## 2020-05-09 LAB — BASIC METABOLIC PANEL
BUN: 15 mg/dL (ref 6–23)
CO2: 28 mEq/L (ref 19–32)
Calcium: 9.8 mg/dL (ref 8.4–10.5)
Chloride: 99 mEq/L (ref 96–112)
Creatinine, Ser: 1.11 mg/dL (ref 0.40–1.50)
GFR: 70 mL/min (ref >60.00)
Glucose, Bld: 151 mg/dL — ABNORMAL HIGH (ref 70–99)
Potassium: 4.9 mEq/L (ref 3.5–5.1)
Sodium: 134 mEq/L — ABNORMAL LOW (ref 135–145)

## 2020-05-09 LAB — HEPATIC FUNCTION PANEL
ALT: 39 U/L (ref 0–53)
AST: 28 U/L (ref 0–37)
Albumin: 4.5 g/dL (ref 3.5–5.2)
Alkaline Phosphatase: 82 U/L (ref 39–117)
Bilirubin, Direct: 0.1 mg/dL (ref 0.0–0.3)
Total Bilirubin: 0.6 mg/dL (ref 0.2–1.2)
Total Protein: 7.3 g/dL (ref 6.0–8.3)

## 2020-05-09 LAB — GAMMA GT: GGT: 173 U/L — ABNORMAL HIGH (ref 7–51)

## 2020-05-09 LAB — HEMOGLOBIN A1C: Hgb A1c MFr Bld: 6.6 % — ABNORMAL HIGH (ref 4.6–6.5)

## 2020-05-09 NOTE — Progress Notes (Addendum)
Established Patient Office Visit  Subjective:  Patient ID: Joseph Horton, male    DOB: Feb 28, 1971  Age: 49 y.o. MRN: [id number]  CC:  Chief Complaint  Patient presents with  . Referral    patitent here to discuss referral to endocrinologist and Hepatologist. Would like mole on head and back checked.     HPI Joseph Horton presents for follow-up of his diabetes.  Fasting sugars have been slightly elevated and he is concerned.  Fasting sugars have been measured as high as 150 but are typically in the 90-1 30 range.  He has added some rice back into his diet.  He is up-to-date on his eye checks he is seeing the dentist regularly.  There is a gastroenterologist who specializes in liver disease at [facility] he would like to see.  He gives me her name and phone number.  Expresses some anxiety about his health care concerns.  He has moles on his head and back he would like for me to take a look at.  Would like to go see the dermatologist.  Past Medical History:  Diagnosis Date  . History of colon polyps   . Hyperlipidemia   . Myocardial infarction ([place])   . Neuromuscular disorder ([place])    Bell's palsy    Past Surgical History:  Procedure Laterality Date  . CARDIAC CATHETERIZATION  05/05/2014   stents placed  . COLONOSCOPY     2018    Family History  Problem Relation Age of Onset  . Asthma Mother   . Hyperlipidemia Mother   . Colon polyps Mother   . Heart disease Father   . Esophageal cancer Neg Hx   . Colon cancer Neg Hx   . Rectal cancer Neg Hx   . Stomach cancer Neg Hx     Social History   Socioeconomic History  . Marital status: Significant Other    Spouse name: Not on file  . Number of children: Not on file  . Years of education: Not on file  . Highest education level: Not on file  Occupational History  . Not on file  Tobacco Use  . Smoking status: Never Smoker  . Smokeless tobacco: Never Used  Vaping Use  . Vaping Use: Never used  Substance and Sexual  Activity  . Alcohol use: Yes    Comment: 2 glasses of wine per week.  . Drug use: Never  . Sexual activity: Not on file  Other Topics Concern  . Not on file  Social History Narrative  . Not on file   Social Determinants of Health   Financial Resource Strain:   . Difficulty of Paying Living Expenses:   Food Insecurity:   . Worried About Charity fundraiser in the Last Year:   . Arboriculturist in the Last Year:   Transportation Needs:   . Film/video editor (Medical):   Marland Kitchen Lack of Transportation (Non-Medical):   Physical Activity:   . Days of Exercise per Week:   . Minutes of Exercise per Session:   Stress:   . Feeling of Stress :   Social Connections:   . Frequency of Communication with Friends and Family:   . Frequency of Social Gatherings with Friends and Family:   . Attends Religious Services:   . Active Member of Clubs or Organizations:   . Attends Archivist Meetings:   Marland Kitchen Marital Status:   Intimate Partner Violence:   . Fear of Current or Ex-Partner:   .  Emotionally Abused:   Marland Kitchen Physically Abused:   . Sexually Abused:     Outpatient Medications Prior to Visit  Medication Sig Dispense Refill  . aspirin 81 MG tablet Take 1 tablet by mouth daily.    . Evolocumab (REPATHA SURECLICK) 268 MG/ML SOAJ Inject 1 Syringe into the skin every 14 (fourteen) days.    Marland Kitchen lisinopril (PRINIVIL,ZESTRIL) 10 MG tablet Take 10 mg by mouth daily.    . metoprolol succinate (TOPROL-XL) 25 MG 24 hr tablet Take 1 tablet by mouth daily.    . nitroGLYCERIN (NITROSTAT) 0.4 MG SL tablet Place 1 tablet under the tongue every 5 minutes as needed for up to 30 days for chest pain.    . Omega-3 1000 MG CAPS Take 1 capsule by mouth daily.    . pantoprazole (PROTONIX) 40 MG tablet Take 1 tablet (40 mg total) by mouth 2 (two) times daily. 90 tablet 3  . JANUMET XR 50-1000 MG TB24 Take 1 tablet by mouth once daily 90 tablet 1   No facility-administered medications prior to visit.     Allergies  Allergen Reactions  . Crab [Shellfish Allergy]     Blue crab  . Rosuvastatin Other (See Comments)    myalgia myalgia   . Statins Other (See Comments)    ROS Review of Systems  Constitutional: Negative.   HENT: Negative.   Eyes: Negative for photophobia and visual disturbance.  Respiratory: Negative.   Cardiovascular: Negative.   Gastrointestinal: Negative.   Endocrine: Negative for polyphagia and polyuria.  Genitourinary: Negative.   Musculoskeletal: Negative for gait problem and joint swelling.  Skin: Negative for pallor and rash.  Neurological: Negative for speech difficulty, numbness and headaches.  Hematological: Does not bruise/bleed easily.  Psychiatric/Behavioral: The patient is nervous/anxious.       Objective:    Physical Exam Vitals and nursing note reviewed.  Constitutional:      General: He is not in acute distress.    Appearance: Normal appearance. He is not ill-appearing, toxic-appearing or diaphoretic.  HENT:     Head: Normocephalic and atraumatic.     Right Ear: Tympanic membrane, ear canal and external ear normal.     Left Ear: Tympanic membrane, ear canal and external ear normal.  Eyes:     General: No scleral icterus.       Right eye: No discharge.        Left eye: No discharge.     Extraocular Movements: Extraocular movements intact.     Conjunctiva/sclera: Conjunctivae normal.     Pupils: Pupils are equal, round, and reactive to light.  Cardiovascular:     Rate and Rhythm: Normal rate and regular rhythm.  Pulmonary:     Effort: Pulmonary effort is normal.     Breath sounds: Normal breath sounds.  Abdominal:     General: Bowel sounds are normal.     Palpations: Abdomen is soft.  Musculoskeletal:     Cervical back: No rigidity or tenderness.  Lymphadenopathy:     Cervical: No cervical adenopathy.  Skin:    General: Skin is warm and dry.     Coloration: Skin is not jaundiced.       Neurological:     Mental Status: He  is alert and oriented to person, place, and time.  Psychiatric:        Mood and Affect: Mood normal.        Behavior: Behavior normal.     BP 114/72   Pulse 70  Temp (!) 97.3 F (36.3 C) (Tympanic)   Ht 5\' 4"  (1.626 m)   Wt 163 lb (73.9 kg)   SpO2 96%   BMI 27.98 kg/m  Wt Readings from Last 3 Encounters:  05/09/20 163 lb (73.9 kg)  03/27/20 162 lb (73.5 kg)  02/28/20 162 lb 4 oz (73.6 kg)     Health Maintenance Due  Topic Date Due  . Hepatitis C Screening  Never done  . COVID-19 Vaccine (1) Never done  . HIV Screening  Never done  . TETANUS/TDAP  Never done    There are no preventive care reminders to display for this patient.  No results found for: TSH Lab Results  Component Value Date   WBC 6.6 01/13/2020   HGB 16.3 01/13/2020   HCT 47.7 01/13/2020   MCV 96.0 01/13/2020   PLT 241.0 01/13/2020   Lab Results  Component Value Date   NA 134 (L) 05/09/2020   K 4.9 05/09/2020   CO2 28 05/09/2020   GLUCOSE 151 (H) 05/09/2020   BUN 15 05/09/2020   CREATININE 1.11 05/09/2020   BILITOT 0.6 05/09/2020   ALKPHOS 82 05/09/2020   AST 28 05/09/2020   ALT 39 05/09/2020   PROT 7.3 05/09/2020   ALBUMIN 4.5 05/09/2020   CALCIUM 9.8 05/09/2020   GFR 70.00 05/09/2020   Lab Results  Component Value Date   CHOL 173 09/08/2019   Lab Results  Component Value Date   HDL 43.70 09/08/2019   Lab Results  Component Value Date   LDLCALC 96 09/08/2019   Lab Results  Component Value Date   TRIG 168.0 (H) 09/08/2019   Lab Results  Component Value Date   CHOLHDL 4 09/08/2019   Lab Results  Component Value Date   HGBA1C 6.6 (H) 05/09/2020      Assessment & Plan:   Problem List Items Addressed This Visit      Endocrine   Type 2 diabetes mellitus without complication, without long-term current use of insulin (HCC)   Relevant Medications   sitaGLIPtin-metformin (JANUMET) 50-1000 MG tablet   Other Relevant Orders   Basic metabolic panel (Completed)    Hemoglobin A1c (Completed)   Ambulatory referral to diabetic education     Other   Elevated serum GGT level - Primary   Relevant Orders   Gamma GT (Completed)   Hepatic function panel (Completed)   Hepatitis C antibody   Ambulatory referral to Gastroenterology   Abnormal CT of liver   Relevant Orders   Ambulatory referral to Gastroenterology   Atypical mole   Relevant Orders   Ambulatory referral to Dermatology      Meds ordered this encounter  Medications  . sitaGLIPtin-metformin (JANUMET) 50-1000 MG tablet    Sig: Take 1 tablet by mouth 2 (two) times daily with a meal.    Dispense:  180 tablet    Refill:  1    Follow-up: Return in about 6 months (around 11/09/2020).    Libby Maw, MD

## 2020-05-10 LAB — HEPATITIS C ANTIBODY
Hepatitis C Ab: NONREACTIVE
SIGNAL TO CUT-OFF: 0.01 (ref <1.00)

## 2020-05-10 MED ORDER — DEXCOM G6 SENSOR MISC: 3 refills | Status: DC

## 2020-05-10 MED ORDER — DEXCOM G6 TRANSMITTER MISC: 3 refills | Status: DC

## 2020-05-10 MED ORDER — JANUMET 50-1000 MG PO TABS: 1.0000 | ORAL_TABLET | Freq: Two times a day (BID) | ORAL | 1 refills | Status: DC

## 2020-05-10 MED ORDER — DEXCOM G6 RECEIVER DEVI: 3 refills | Status: DC

## 2020-05-10 NOTE — Addendum Note (Signed)
Addended by: Jon Billings on: 05/10/2020 07:45 AM   Modules accepted: Orders

## 2020-05-10 NOTE — Progress Notes (Signed)
Diabetes control is slightly under worse control but is still reasonably control. I have ordered diabetic teaching for you and have increased janumet to twice daily. No need for referral to endocrinology at this point.   GGT is elevated but slightly lower. Agree with discussed referral to [facility] hepatologist.

## 2020-05-18 DIAGNOSIS — L820 Inflamed seborrheic keratosis: Secondary | ICD-10-CM | POA: Diagnosis not present

## 2020-05-18 DIAGNOSIS — L573 Poikiloderma of Civatte: Secondary | ICD-10-CM | POA: Diagnosis not present

## 2020-05-18 DIAGNOSIS — B353 Tinea pedis: Secondary | ICD-10-CM | POA: Diagnosis not present

## 2020-05-18 DIAGNOSIS — L814 Other melanin hyperpigmentation: Secondary | ICD-10-CM | POA: Diagnosis not present

## 2020-05-18 DIAGNOSIS — L821 Other seborrheic keratosis: Secondary | ICD-10-CM | POA: Diagnosis not present

## 2020-07-03 ENCOUNTER — Other Ambulatory Visit: Payer: Self-pay

## 2020-07-03 ENCOUNTER — Encounter: Payer: Self-pay | Admitting: Dietician

## 2020-07-03 ENCOUNTER — Encounter: Payer: BC Managed Care – PPO | Attending: Family Medicine | Admitting: Dietician

## 2020-07-03 DIAGNOSIS — E119 Type 2 diabetes mellitus without complications: Secondary | ICD-10-CM | POA: Insufficient documentation

## 2020-07-03 NOTE — Progress Notes (Signed)
Diabetes Self-Management Education  Visit Type: First/Initial  07/03/2020  Mr. Joseph Horton, identified by name and date of birth, is a 49 y.o. male with a diagnosis of Diabetes: Type 2.   ASSESSMENT  Patient follows a clean diet which avoids nitrates and limits meat. Allergic to blue crab, otherwise seafood does well. Tries to eat organic and clean as much as possible, and cooks majority of foods at home to avoid eating out. Typical meal pattern is 3 meals per day plus 1-2 snacks. Drinks primarily water. States his A1c has slowly increased over time and he is concerned about this. Does some walking throughout the week, no structured exercise. States his blood sugars are often high in the morning despite being lower at night, so we discussed potential causes of this. We also discussed other factors that affect blood sugar aside from just food.    Diabetes Self-Management Education - 07/03/20 1613      Visit Information   Visit Type First/Initial      Initial Visit   Diabetes Type Type 2    Are you currently following a meal plan? No    Are you taking your medications as prescribed? Yes    Date Diagnosed 05/09/20      Psychosocial Assessment   Patient Belief/Attitude about Diabetes Motivated to manage diabetes    Self-care barriers None    Self-management support Doctor's office    Patient Concerns Nutrition/Meal planning;Glycemic Control    Special Needs None    Preferred Learning Style No preference indicated    Learning Readiness Ready    How often do you need to have someone help you when you read instructions, pamphlets, or other written materials from your doctor or pharmacy? 1 - Never    What is the last grade level you completed in school? grad school      Complications   Last HgB A1C per patient/outside source 6.6 %   05/09/20   How often do you check your blood sugar? > 4 times/day      Dietary Intake   Breakfast hard boiled egg whites + multigrain toast + cream cheese +  smoothie (fruit + almond milk + chia seeds + maple syrup)    Lunch salad + potato casserole + ham + cheese + apple    Dinner [place] yogurt (no sugar added) + fruit + granola + maple syrup    Snack (evening) popcorn   or almonds; or chips   Beverage(s) water      Exercise   Exercise Type Light (walking / raking leaves)    How many days per week to you exercise? 2    How many minutes per day do you exercise? 30    Total minutes per week of exercise 60      Patient Education   Previous Diabetes Education No    Disease state  Explored patient's options for treatment of their diabetes    Nutrition management  Role of diet in the treatment of diabetes and the relationship between the three main macronutrients and blood glucose level;Food label reading, portion sizes and measuring food.;Meal options for control of blood glucose level and chronic complications.    Physical activity and exercise  Role of exercise on diabetes management, blood pressure control and cardiac health.      Individualized Goals (developed by patient)   Nutrition General guidelines for healthy choices and portions discussed      Outcomes   Expected Outcomes Demonstrated interest in learning. Expect  positive outcomes    Future DMSE PRN           Individualized Plan for Diabetes Self-Management Training:  Learning Objective:  Patient will have a greater understanding of diabetes self-management. Patient education plan is to attend individual and/or group sessions per assessed needs and concerns.   Expected Outcomes:  Demonstrated interest in learning. Expect positive outcomes  Education material provided: My Plate, Meal Ideas, Balanced Snacks  If problems or questions, patient to contact team via:  Phone and Email  Future DSME appointment: PRN

## 2020-07-09 DIAGNOSIS — K760 Fatty (change of) liver, not elsewhere classified: Secondary | ICD-10-CM | POA: Insufficient documentation

## 2020-07-17 ENCOUNTER — Encounter: Payer: Self-pay | Admitting: Family Medicine

## 2020-07-17 ENCOUNTER — Ambulatory Visit (INDEPENDENT_AMBULATORY_CARE_PROVIDER_SITE_OTHER): Payer: BC Managed Care – PPO | Admitting: Family Medicine

## 2020-07-17 ENCOUNTER — Other Ambulatory Visit: Payer: Self-pay

## 2020-07-17 VITALS — BP 115/78 | HR 73 | Temp 97.3°F | Ht 64.0 in | Wt 165.8 lb

## 2020-07-17 DIAGNOSIS — E875 Hyperkalemia: Secondary | ICD-10-CM

## 2020-07-17 DIAGNOSIS — Z23 Encounter for immunization: Secondary | ICD-10-CM | POA: Diagnosis not present

## 2020-07-17 DIAGNOSIS — Z789 Other specified health status: Secondary | ICD-10-CM | POA: Diagnosis not present

## 2020-07-17 DIAGNOSIS — Z125 Encounter for screening for malignant neoplasm of prostate: Secondary | ICD-10-CM | POA: Diagnosis not present

## 2020-07-17 DIAGNOSIS — E119 Type 2 diabetes mellitus without complications: Secondary | ICD-10-CM | POA: Diagnosis not present

## 2020-07-17 DIAGNOSIS — E7800 Pure hypercholesterolemia, unspecified: Secondary | ICD-10-CM

## 2020-07-17 DIAGNOSIS — I2583 Coronary atherosclerosis due to lipid rich plaque: Secondary | ICD-10-CM | POA: Insufficient documentation

## 2020-07-17 DIAGNOSIS — I2510 Atherosclerotic heart disease of native coronary artery without angina pectoris: Secondary | ICD-10-CM | POA: Insufficient documentation

## 2020-07-17 DIAGNOSIS — Z0000 Encounter for general adult medical examination without abnormal findings: Secondary | ICD-10-CM | POA: Diagnosis not present

## 2020-07-17 LAB — COMPREHENSIVE METABOLIC PANEL
ALT: 29 U/L (ref 0–53)
AST: 19 U/L (ref 0–37)
Albumin: 4.7 g/dL (ref 3.5–5.2)
Alkaline Phosphatase: 70 U/L (ref 39–117)
BUN: 17 mg/dL (ref 6–23)
CO2: 29 mEq/L (ref 19–32)
Calcium: 9.6 mg/dL (ref 8.4–10.5)
Chloride: 103 mEq/L (ref 96–112)
Creatinine, Ser: 1.21 mg/dL (ref 0.40–1.50)
GFR: 63.32 mL/min (ref >60.00)
Glucose, Bld: 138 mg/dL — ABNORMAL HIGH (ref 70–99)
Potassium: 5.6 mEq/L — ABNORMAL HIGH (ref 3.5–5.1)
Sodium: 137 mEq/L (ref 135–145)
Total Bilirubin: 0.6 mg/dL (ref 0.2–1.2)
Total Protein: 7.3 g/dL (ref 6.0–8.3)

## 2020-07-17 LAB — URINALYSIS, ROUTINE W REFLEX MICROSCOPIC
Bilirubin Urine: NEGATIVE
Hgb urine dipstick: NEGATIVE
Ketones, ur: NEGATIVE
Leukocytes,Ua: NEGATIVE
Nitrite: NEGATIVE
RBC / HPF: NONE SEEN (ref 0–?)
Specific Gravity, Urine: 1.02 (ref 1.000–1.030)
Total Protein, Urine: NEGATIVE
Urine Glucose: NEGATIVE
Urobilinogen, UA: 0.2 (ref 0.0–1.0)
pH: 6 (ref 5.0–8.0)

## 2020-07-17 LAB — CBC
HCT: 46.9 % (ref 39.0–52.0)
Hemoglobin: 16.1 g/dL (ref 13.0–17.0)
MCHC: 34.3 g/dL (ref 30.0–36.0)
MCV: 96.4 fl (ref 78.0–100.0)
Platelets: 214 10*3/uL (ref 150.0–400.0)
RBC: 4.87 Mil/uL (ref 4.22–5.81)
RDW: 13.1 % (ref 11.5–15.5)
WBC: 7.2 10*3/uL (ref 4.0–10.5)

## 2020-07-17 LAB — LIPID PANEL
Cholesterol: 162 mg/dL (ref 0–200)
HDL: 50.8 mg/dL (ref >39.00)
LDL Cholesterol: 88 mg/dL (ref 0–99)
NonHDL: 110.93
Total CHOL/HDL Ratio: 3
Triglycerides: 113 mg/dL (ref 0.0–149.0)
VLDL: 22.6 mg/dL (ref 0.0–40.0)

## 2020-07-17 LAB — HEMOGLOBIN A1C: Hgb A1c MFr Bld: 6.4 % (ref 4.6–6.5)

## 2020-07-17 LAB — PSA: PSA: 0.88 ng/mL (ref 0.10–4.00)

## 2020-07-17 NOTE — Progress Notes (Signed)
Established Patient Office Visit  Subjective:  Patient ID: Joseph Horton, male    DOB: 09-Nov-1970  Age: 49 y.o. MRN: [id number]  CC:  Chief Complaint  Patient presents with  . Follow-up    6 month follow up, would like blood work today. Much improved knee    HPI [name] presents for follow-up of diabetes, elevated GGT and right knee pain.  Knee is improved greatly.  Continues with Janumet for his diabetes.  Continuous glucose monitoring.  S/p check back in February.  He is seen a hematologist at [facility].  They had wondered why checked a GGT.  His shortness of that he is having only 1 or 2 beers weekly there is no other alcohol.  Urine flow is good.  He gets up once at night to pee.  Patient tells me that her pathologist is not worried about the elevation in his GGT.  Past Medical History:  Diagnosis Date  . History of colon polyps   . Hyperlipidemia   . Myocardial infarction ([place])   . Neuromuscular disorder ([place])    Bell's palsy    Past Surgical History:  Procedure Laterality Date  . CARDIAC CATHETERIZATION  05/05/2014   stents placed  . COLONOSCOPY     2018    Family History  Problem Relation Age of Onset  . Asthma Mother   . Hyperlipidemia Mother   . Colon polyps Mother   . Heart disease Father   . Esophageal cancer Neg Hx   . Colon cancer Neg Hx   . Rectal cancer Neg Hx   . Stomach cancer Neg Hx     Social History   Socioeconomic History  . Marital status: Significant Other    Spouse name: Not on file  . Number of children: Not on file  . Years of education: Not on file  . Highest education level: Not on file  Occupational History  . Not on file  Tobacco Use  . Smoking status: Never Smoker  . Smokeless tobacco: Never Used  Vaping Use  . Vaping Use: Never used  Substance and Sexual Activity  . Alcohol use: Yes    Comment: 2 glasses of wine per week.  . Drug use: Never  . Sexual activity: Not on file  Other Topics Concern  . Not on file    Social History Narrative  . Not on file   Social Determinants of Health   Financial Resource Strain:   . Difficulty of Paying Living Expenses: Not on file  Food Insecurity:   . Worried About Charity fundraiser in the Last Year: Not on file  . Ran Out of Food in the Last Year: Not on file  Transportation Needs:   . Lack of Transportation (Medical): Not on file  . Lack of Transportation (Non-Medical): Not on file  Physical Activity:   . Days of Exercise per Week: Not on file  . Minutes of Exercise per Session: Not on file  Stress:   . Feeling of Stress : Not on file  Social Connections:   . Frequency of Communication with Friends and Family: Not on file  . Frequency of Social Gatherings with Friends and Family: Not on file  . Attends Religious Services: Not on file  . Active Member of Clubs or Organizations: Not on file  . Attends Archivist Meetings: Not on file  . Marital Status: Not on file  Intimate Partner Violence:   . Fear of Current or Ex-Partner: Not  on file  . Emotionally Abused: Not on file  . Physically Abused: Not on file  . Sexually Abused: Not on file    Outpatient Medications Prior to Visit  Medication Sig Dispense Refill  . aspirin 81 MG tablet Take 1 tablet by mouth daily.    . Continuous Blood Gluc Receiver (DEXCOM G6 RECEIVER) DEVI Use daily. DX: E11.65 12 each 3  . Continuous Blood Gluc Sensor (DEXCOM G6 SENSOR) MISC Use to test blood sugar. DX: E11.65 12 each 3  . Continuous Blood Gluc Transmit (DEXCOM G6 TRANSMITTER) MISC Use to test blood sugar. DX: E11.6 12 each 3  . Evolocumab (REPATHA SURECLICK) 784 MG/ML SOAJ Inject 1 Syringe into the skin every 14 (fourteen) days.    Marland Kitchen lisinopril (PRINIVIL,ZESTRIL) 10 MG tablet Take 10 mg by mouth daily.    . metoprolol succinate (TOPROL-XL) 25 MG 24 hr tablet Take 1 tablet by mouth daily.    . nitroGLYCERIN (NITROSTAT) 0.4 MG SL tablet Place 1 tablet under the tongue every 5 minutes as needed for up to  30 days for chest pain.    . Omega-3 1000 MG CAPS Take 1 capsule by mouth daily.    . pantoprazole (PROTONIX) 40 MG tablet Take 1 tablet (40 mg total) by mouth 2 (two) times daily. 90 tablet 3  . sitaGLIPtin-metformin (JANUMET) 50-1000 MG tablet Take 1 tablet by mouth 2 (two) times daily with a meal. 180 tablet 1   No facility-administered medications prior to visit.    Allergies  Allergen Reactions  . Crab [Shellfish Allergy]     Blue crab  . Rosuvastatin Other (See Comments)    myalgia myalgia   . Statins Other (See Comments)    ROS Review of Systems  Constitutional: Negative.   HENT: Negative.   Eyes: Negative for photophobia and visual disturbance.  Respiratory: Negative.   Cardiovascular: Negative.   Gastrointestinal: Negative.   Endocrine: Negative for polyphagia and polyuria.  Genitourinary: Negative for difficulty urinating, frequency and urgency.  Musculoskeletal: Negative for gait problem and joint swelling.  Skin: Negative for pallor and rash.  Allergic/Immunologic: Negative for immunocompromised state.  Neurological: Negative for tremors and speech difficulty.  Hematological: Does not bruise/bleed easily.  Psychiatric/Behavioral: Negative.       Objective:    Physical Exam Vitals and nursing note reviewed.  Constitutional:      General: He is not in acute distress.    Appearance: Normal appearance. He is normal weight. He is not ill-appearing, toxic-appearing or diaphoretic.  HENT:     Head: Normocephalic and atraumatic.     Right Ear: Tympanic membrane, ear canal and external ear normal.     Left Ear: Tympanic membrane, ear canal and external ear normal.     Mouth/Throat:     Mouth: Mucous membranes are moist.     Pharynx: Oropharynx is clear. No oropharyngeal exudate.  Eyes:     General: No scleral icterus.       Right eye: No discharge.        Left eye: No discharge.     Extraocular Movements: Extraocular movements intact.      Conjunctiva/sclera: Conjunctivae normal.     Pupils: Pupils are equal, round, and reactive to light.  Cardiovascular:     Rate and Rhythm: Normal rate and regular rhythm.  Pulmonary:     Effort: Pulmonary effort is normal.     Breath sounds: Normal breath sounds.  Abdominal:     General: Abdomen is flat. Bowel sounds  are normal. There is no distension.     Palpations: Abdomen is soft. There is no mass.     Tenderness: There is no abdominal tenderness. There is no guarding or rebound.     Hernia: No hernia is present.  Musculoskeletal:     Cervical back: No rigidity or tenderness.  Lymphadenopathy:     Cervical: No cervical adenopathy.  Skin:    General: Skin is warm and dry.  Neurological:     Mental Status: He is alert and oriented to person, place, and time.  Psychiatric:        Mood and Affect: Mood normal.        Behavior: Behavior normal.     BP 115/78   Pulse 73   Temp (!) 97.3 F (36.3 C) (Tympanic)   Ht 5\' 4"  (1.626 m)   Wt 165 lb 12.8 oz (75.2 kg)   SpO2 95%   BMI 28.46 kg/m  Wt Readings from Last 3 Encounters:  07/17/20 165 lb 12.8 oz (75.2 kg)  05/09/20 163 lb (73.9 kg)  03/27/20 162 lb (73.5 kg)     Health Maintenance Due  Topic Date Due  . HIV Screening  Never done  . TETANUS/TDAP  Never done    There are no preventive care reminders to display for this patient.  No results found for: TSH Lab Results  Component Value Date   WBC 6.6 01/13/2020   HGB 16.3 01/13/2020   HCT 47.7 01/13/2020   MCV 96.0 01/13/2020   PLT 241.0 01/13/2020   Lab Results  Component Value Date   NA 134 (L) 05/09/2020   K 4.9 05/09/2020   CO2 28 05/09/2020   GLUCOSE 151 (H) 05/09/2020   BUN 15 05/09/2020   CREATININE 1.11 05/09/2020   BILITOT 0.6 05/09/2020   ALKPHOS 82 05/09/2020   AST 28 05/09/2020   ALT 39 05/09/2020   PROT 7.3 05/09/2020   ALBUMIN 4.5 05/09/2020   CALCIUM 9.8 05/09/2020   GFR 70.00 05/09/2020   Lab Results  Component Value Date   CHOL  173 09/08/2019   Lab Results  Component Value Date   HDL 43.70 09/08/2019   Lab Results  Component Value Date   LDLCALC 96 09/08/2019   Lab Results  Component Value Date   TRIG 168.0 (H) 09/08/2019   Lab Results  Component Value Date   CHOLHDL 4 09/08/2019   Lab Results  Component Value Date   HGBA1C 6.6 (H) 05/09/2020      Assessment & Plan:   Problem List Items Addressed This Visit      Endocrine   Type 2 diabetes mellitus without complication, without long-term current use of insulin (HCC)   Relevant Orders   CBC   Comprehensive metabolic panel   Urinalysis, Routine w reflex microscopic   Hemoglobin A1c     Other   Need for influenza vaccination - Primary   Relevant Orders   Flu Vaccine QUAD 6+ mos PF IM (Fluarix Quad PF) (Completed)   Healthcare maintenance   Relevant Orders   PSA   Statin intolerance   Elevated cholesterol   Relevant Orders   Lipid panel      No orders of the defined types were placed in this encounter.   Follow-up: Return in about 6 months (around 01/14/2021).   Continue all current medications. He will continue to follow-up with [facility] hepatology.  Suggested that he fluid restrict a few hours before work retiring. Libby Maw, MD

## 2020-07-18 DIAGNOSIS — E875 Hyperkalemia: Secondary | ICD-10-CM | POA: Insufficient documentation

## 2020-07-18 NOTE — Addendum Note (Signed)
Addended by: Abelino Derrick A on: 07/18/2020 08:00 AM   Modules accepted: Orders

## 2020-07-19 ENCOUNTER — Other Ambulatory Visit: Payer: Self-pay

## 2020-07-19 ENCOUNTER — Other Ambulatory Visit (INDEPENDENT_AMBULATORY_CARE_PROVIDER_SITE_OTHER): Payer: BC Managed Care – PPO

## 2020-07-19 DIAGNOSIS — E875 Hyperkalemia: Secondary | ICD-10-CM

## 2020-07-19 LAB — POTASSIUM: Potassium: 4.5 mEq/L (ref 3.5–5.1)

## 2020-11-24 ENCOUNTER — Other Ambulatory Visit: Payer: Self-pay | Admitting: Family Medicine

## 2020-11-24 DIAGNOSIS — E119 Type 2 diabetes mellitus without complications: Secondary | ICD-10-CM

## 2020-12-21 IMAGING — DX DG KNEE COMPLETE 4+V*L*
4 series · 4 of 4 positions shown · non-contrast
Comparison: None.

CLINICAL DATA: Chronic left knee pain without known injury.

EXAM:
LEFT KNEE - COMPLETE 4+ VIEW

[knee ap]
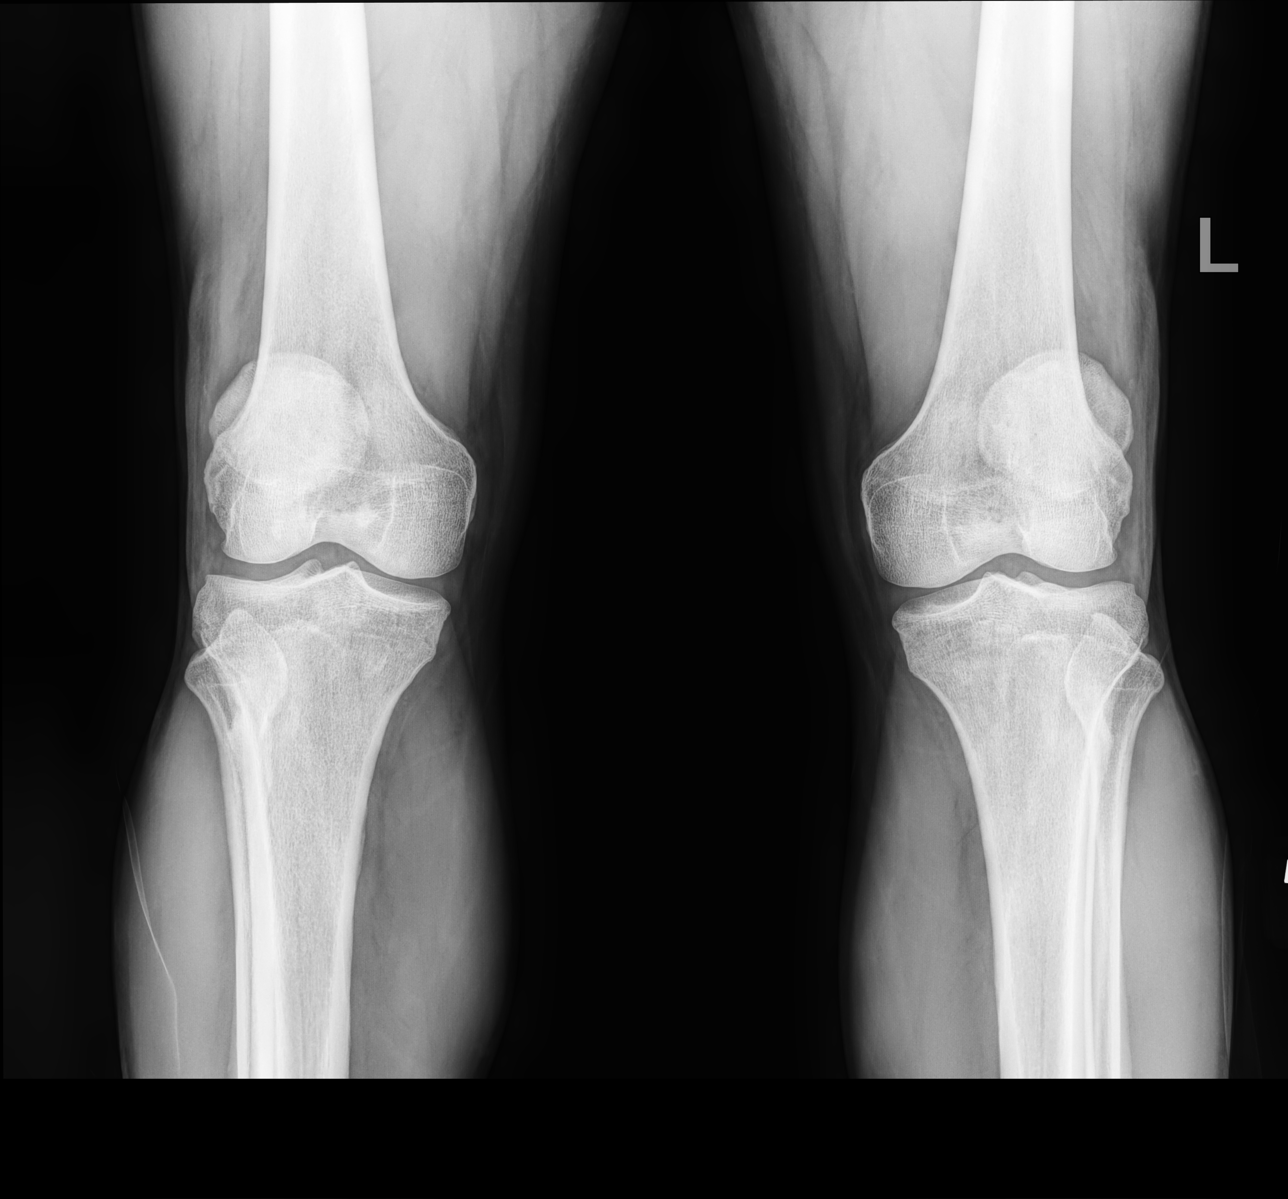

[knee [person_name] view pa]
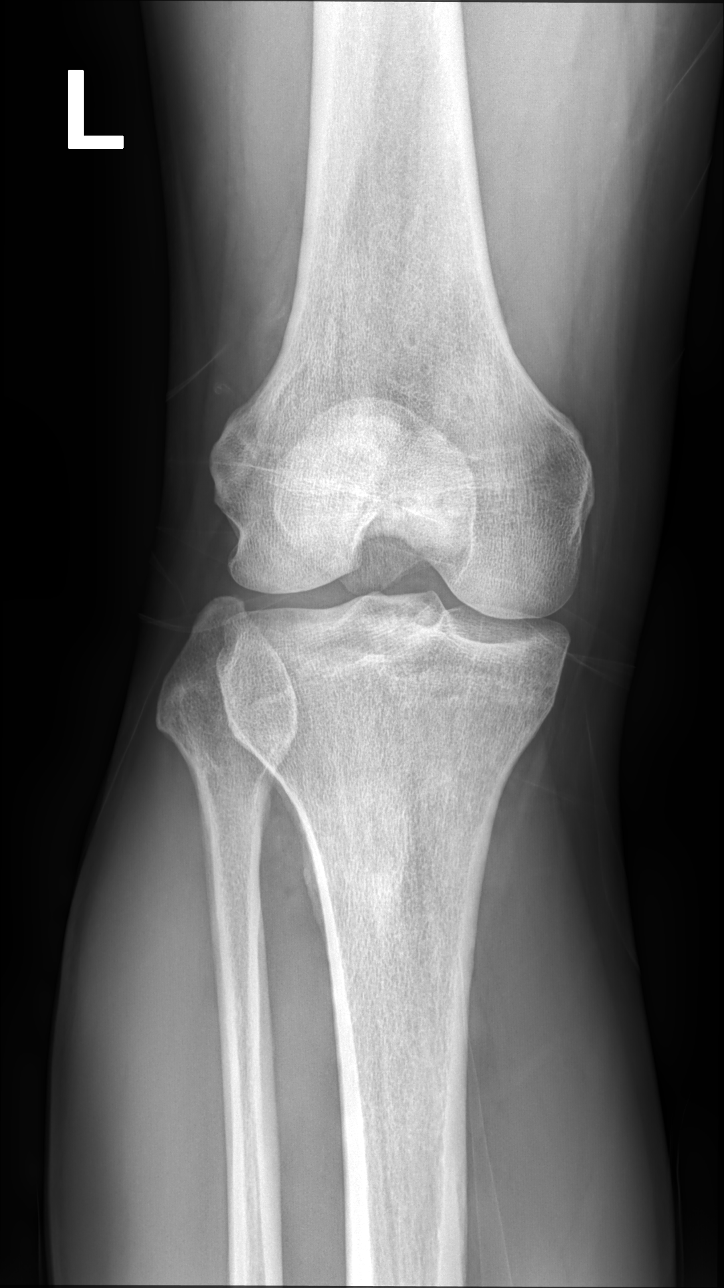

[knee lat]
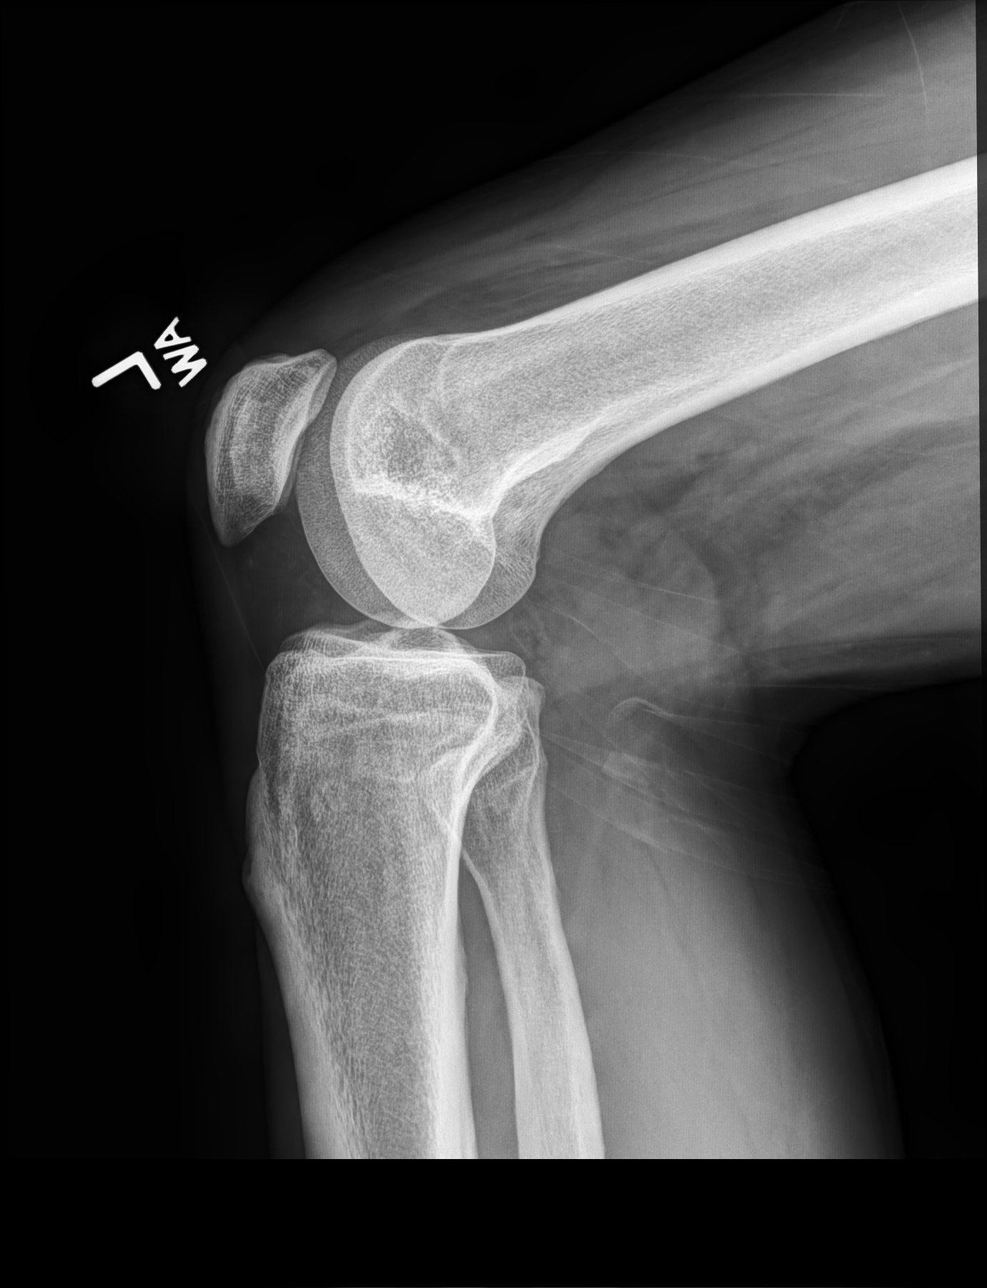

[patella (sunrise)]
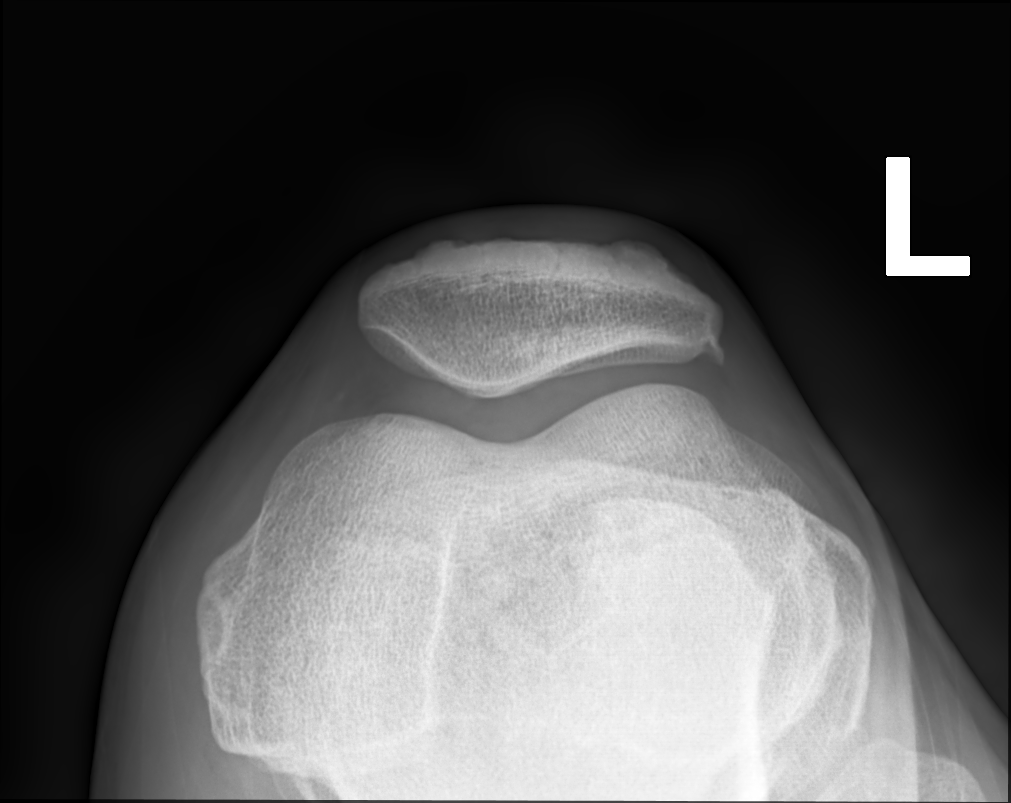

[4 of 4 positions shown; findings below may reference images not displayed]

FINDINGS: No evidence of fracture, dislocation, or joint effusion. No evidence
of arthropathy or other focal bone abnormality. Soft tissues are
unremarkable.
IMPRESSION: Negative.

## 2020-12-23 ENCOUNTER — Other Ambulatory Visit: Payer: Self-pay | Admitting: Family

## 2020-12-23 DIAGNOSIS — E119 Type 2 diabetes mellitus without complications: Secondary | ICD-10-CM

## 2020-12-27 NOTE — Telephone Encounter (Signed)
Patient notified VIA phone. Dm/cma  

## 2020-12-27 NOTE — Telephone Encounter (Signed)
Patient is calling to get a refill on his Janumet. Please call him once this has been sent in.

## 2021-02-18 ENCOUNTER — Encounter: Payer: Self-pay | Admitting: Family Medicine

## 2021-02-18 ENCOUNTER — Ambulatory Visit (INDEPENDENT_AMBULATORY_CARE_PROVIDER_SITE_OTHER): Payer: BC Managed Care – PPO | Admitting: Family Medicine

## 2021-02-18 ENCOUNTER — Other Ambulatory Visit: Payer: Self-pay

## 2021-02-18 VITALS — BP 124/76 | HR 71 | Temp 97.4°F | Ht 65.0 in | Wt 168.8 lb

## 2021-02-18 DIAGNOSIS — Z0000 Encounter for general adult medical examination without abnormal findings: Secondary | ICD-10-CM

## 2021-02-18 DIAGNOSIS — K760 Fatty (change of) liver, not elsewhere classified: Secondary | ICD-10-CM | POA: Diagnosis not present

## 2021-02-18 DIAGNOSIS — E119 Type 2 diabetes mellitus without complications: Secondary | ICD-10-CM | POA: Diagnosis not present

## 2021-02-18 DIAGNOSIS — E7800 Pure hypercholesterolemia, unspecified: Secondary | ICD-10-CM | POA: Diagnosis not present

## 2021-02-18 DIAGNOSIS — Z789 Other specified health status: Secondary | ICD-10-CM | POA: Diagnosis not present

## 2021-02-18 DIAGNOSIS — Z23 Encounter for immunization: Secondary | ICD-10-CM | POA: Diagnosis not present

## 2021-02-18 LAB — MICROALBUMIN / CREATININE URINE RATIO
Creatinine,U: 49.1 mg/dL
Microalb Creat Ratio: 1.4 mg/g (ref 0.0–30.0)
Microalb, Ur: <0.7 mg/dL (ref 0.0–1.9)

## 2021-02-18 LAB — LIPID PANEL
Cholesterol: 167 mg/dL (ref 0–200)
HDL: 46.3 mg/dL (ref >39.00)
LDL Cholesterol: 90 mg/dL (ref 0–99)
NonHDL: 120.61
Total CHOL/HDL Ratio: 4
Triglycerides: 152 mg/dL — ABNORMAL HIGH (ref 0.0–149.0)
VLDL: 30.4 mg/dL (ref 0.0–40.0)

## 2021-02-18 LAB — URINALYSIS, ROUTINE W REFLEX MICROSCOPIC
Bilirubin Urine: NEGATIVE
Hgb urine dipstick: NEGATIVE
Ketones, ur: NEGATIVE
Leukocytes,Ua: NEGATIVE
Nitrite: NEGATIVE
RBC / HPF: NONE SEEN (ref 0–?)
Specific Gravity, Urine: <=1.005 — AB (ref 1.000–1.030)
Total Protein, Urine: NEGATIVE
Urine Glucose: 250 — AB
Urobilinogen, UA: 0.2 (ref 0.0–1.0)
WBC, UA: NONE SEEN (ref 0–?)
pH: 6.5 (ref 5.0–8.0)

## 2021-02-18 LAB — CBC
HCT: 45.2 % (ref 39.0–52.0)
Hemoglobin: 15.5 g/dL (ref 13.0–17.0)
MCHC: 34.4 g/dL (ref 30.0–36.0)
MCV: 95.1 fl (ref 78.0–100.0)
Platelets: 236 10*3/uL (ref 150.0–400.0)
RBC: 4.75 Mil/uL (ref 4.22–5.81)
RDW: 12.9 % (ref 11.5–15.5)
WBC: 6.1 10*3/uL (ref 4.0–10.5)

## 2021-02-18 LAB — COMPREHENSIVE METABOLIC PANEL
ALT: 69 U/L — ABNORMAL HIGH (ref 0–53)
AST: 33 U/L (ref 0–37)
Albumin: 4.2 g/dL (ref 3.5–5.2)
Alkaline Phosphatase: 98 U/L (ref 39–117)
BUN: 11 mg/dL (ref 6–23)
CO2: 26 mEq/L (ref 19–32)
Calcium: 9 mg/dL (ref 8.4–10.5)
Chloride: 102 mEq/L (ref 96–112)
Creatinine, Ser: 1.09 mg/dL (ref 0.40–1.50)
GFR: 78.74 mL/min (ref >60.00)
Glucose, Bld: 170 mg/dL — ABNORMAL HIGH (ref 70–99)
Potassium: 4.4 mEq/L (ref 3.5–5.1)
Sodium: 136 mEq/L (ref 135–145)
Total Bilirubin: 0.6 mg/dL (ref 0.2–1.2)
Total Protein: 6.8 g/dL (ref 6.0–8.3)

## 2021-02-18 LAB — HEMOGLOBIN A1C: Hgb A1c MFr Bld: 6.7 % — ABNORMAL HIGH (ref 4.6–6.5)

## 2021-02-18 NOTE — Progress Notes (Signed)
Established Patient Office Visit  Subjective:  Patient ID: Joseph Horton, male    DOB: 01-24-1971  Age: 50 y.o. MRN: [id number]  CC:  Chief Complaint  Patient presents with  . Annual Exam    CPE, patient fasting for labs. No concerns.     HPI Joseph Horton presents for his yearly physical and follow-up of his type 2 diabetes.  History of cardiovascular disease with statin intolerance followed by cardiology.  History of fatty of the liver disease also followed at [facility] liver clinic.  Status post recent eye check.  Continuous glucose monitoring does show blood sugars are up and down depending on what he has eaten recently.  Continues Repatha per cardiology.  He is also taking fish oil.  Not exercising too much.  He is busy at work.  Past Medical History:  Diagnosis Date  . History of colon polyps   . Hyperlipidemia   . Myocardial infarction ([place])   . Neuromuscular disorder ([place])    Bell's palsy    Past Surgical History:  Procedure Laterality Date  . CARDIAC CATHETERIZATION  05/05/2014   stents placed  . COLONOSCOPY     2018    Family History  Problem Relation Age of Onset  . Asthma Mother   . Hyperlipidemia Mother   . Colon polyps Mother   . Heart disease Father   . Esophageal cancer Neg Hx   . Colon cancer Neg Hx   . Rectal cancer Neg Hx   . Stomach cancer Neg Hx     Social History   Socioeconomic History  . Marital status: Significant Other    Spouse name: Not on file  . Number of children: Not on file  . Years of education: Not on file  . Highest education level: Not on file  Occupational History  . Not on file  Tobacco Use  . Smoking status: Never Smoker  . Smokeless tobacco: Never Used  Vaping Use  . Vaping Use: Never used  Substance and Sexual Activity  . Alcohol use: Yes    Comment: 2 glasses of wine per week.  . Drug use: Never  . Sexual activity: Not on file  Other Topics Concern  . Not on file  Social History Narrative  . Not on file    Social Determinants of Health   Financial Resource Strain: Not on file  Food Insecurity: Not on file  Transportation Needs: Not on file  Physical Activity: Not on file  Stress: Not on file  Social Connections: Not on file  Intimate Partner Violence: Not on file    Outpatient Medications Prior to Visit  Medication Sig Dispense Refill  . aspirin 81 MG tablet Take 1 tablet by mouth daily.    . Continuous Blood Gluc Receiver (DEXCOM G6 RECEIVER) DEVI Use daily. DX: E11.65 12 each 3  . Continuous Blood Gluc Sensor (DEXCOM G6 SENSOR) MISC Use to test blood sugar. DX: E11.65 12 each 3  . Continuous Blood Gluc Transmit (DEXCOM G6 TRANSMITTER) MISC Use to test blood sugar. DX: E11.6 12 each 3  . Evolocumab (REPATHA SURECLICK) 409 MG/ML SOAJ Inject 1 Syringe into the skin every 14 (fourteen) days.    Marland Kitchen JANUMET 50-1000 MG tablet TAKE 1 TABLET BY MOUTH TWICE DAILY WITH A MEAL 60 tablet 1  . lisinopril (PRINIVIL,ZESTRIL) 10 MG tablet Take 10 mg by mouth daily.    . metoprolol succinate (TOPROL-XL) 25 MG 24 hr tablet Take 1 tablet by mouth daily.    Marland Kitchen  nitroGLYCERIN (NITROSTAT) 0.4 MG SL tablet Place 1 tablet under the tongue every 5 minutes as needed for up to 30 days for chest pain.    . Omega-3 1000 MG CAPS Take 1 capsule by mouth daily.    . pantoprazole (PROTONIX) 40 MG tablet Take 1 tablet (40 mg total) by mouth 2 (two) times daily. 90 tablet 3   No facility-administered medications prior to visit.    Allergies  Allergen Reactions  . Crab [Shellfish Allergy]     Blue crab  . Rosuvastatin Other (See Comments)    myalgia myalgia   . Statins Other (See Comments)    ROS Review of Systems  Constitutional: Negative.   HENT: Negative.   Eyes: Negative for photophobia and visual disturbance.  Respiratory: Negative.   Cardiovascular: Negative.   Gastrointestinal: Negative.   Endocrine: Negative for polyphagia and polyuria.  Genitourinary: Negative for difficulty urinating, frequency  and urgency.  Musculoskeletal: Negative for gait problem and joint swelling.  Skin: Negative for color change and pallor.  Neurological: Negative for speech difficulty and weakness.  Hematological: Does not bruise/bleed easily.  Psychiatric/Behavioral: Negative.    Depression screen [facility] 2/9 02/18/2021 07/03/2020 01/13/2020  Decreased Interest 0 0 0  Down, Depressed, Hopeless 0 0 0  PHQ - 2 Score 0 0 0      Objective:    Physical Exam Vitals and nursing note reviewed.  Constitutional:      General: He is not in acute distress.    Appearance: Normal appearance. He is not ill-appearing or toxic-appearing.  HENT:     Head: Normocephalic and atraumatic.     Right Ear: Tympanic membrane, ear canal and external ear normal.     Left Ear: Tympanic membrane, ear canal and external ear normal.     Mouth/Throat:     Mouth: Mucous membranes are moist.     Pharynx: Oropharynx is clear. No oropharyngeal exudate or posterior oropharyngeal erythema.  Eyes:     General:        Right eye: No discharge.        Left eye: No discharge.     Extraocular Movements: Extraocular movements intact.     Conjunctiva/sclera: Conjunctivae normal.     Pupils: Pupils are equal, round, and reactive to light.  Neck:     Vascular: No carotid bruit.  Cardiovascular:     Rate and Rhythm: Normal rate and regular rhythm.     Pulses:          Carotid pulses are 2+ on the right side and 2+ on the left side.      Dorsalis pedis pulses are 2+ on the right side and 2+ on the left side.       Posterior tibial pulses are 2+ on the right side and 2+ on the left side.  Pulmonary:     Effort: Pulmonary effort is normal.     Breath sounds: Normal breath sounds.  Abdominal:     General: Bowel sounds are normal. There is no distension.     Palpations: There is no mass.     Tenderness: There is no abdominal tenderness. There is no guarding or rebound.     Hernia: No hernia is present.  Genitourinary:    Prostate: Not  enlarged, not tender and no nodules present.     Rectum: Guaiac result negative. No mass, tenderness, anal fissure, external hemorrhoid or internal hemorrhoid. Normal anal tone.  Musculoskeletal:     Cervical back: No rigidity or  tenderness.  Lymphadenopathy:     Cervical: No cervical adenopathy.  Skin:    General: Skin is warm and dry.  Neurological:     Mental Status: He is alert and oriented to person, place, and time.  Psychiatric:        Mood and Affect: Mood normal.        Behavior: Behavior normal.    Diabetic Foot Exam - Simple   Simple Foot Form Diabetic Foot exam was performed with the following findings: Yes 02/18/2021  8:52 AM  Visual Inspection No deformities, no ulcerations, no other skin breakdown bilaterally: Yes Sensation Testing Intact to touch and monofilament testing bilaterally: Yes Pulse Check Posterior Tibialis and Dorsalis pulse intact bilaterally: Yes Comments  Feet are bilaterally cavus.  There are no lesions or rash.    BP 124/76   Pulse 71   Temp (!) 97.4 F (36.3 C) (Temporal)   Ht 5\' 5"  (1.651 m)   Wt 168 lb 12.8 oz (76.6 kg)   SpO2 98%   BMI 28.09 kg/m  Wt Readings from Last 3 Encounters:  02/18/21 168 lb 12.8 oz (76.6 kg)  07/17/20 165 lb 12.8 oz (75.2 kg)  05/09/20 163 lb (73.9 kg)     Health Maintenance Due  Topic Date Due  . HIV Screening  Never done  . TETANUS/TDAP  Never done  . HEMOGLOBIN A1C  01/14/2021    There are no preventive care reminders to display for this patient.  No results found for: TSH Lab Results  Component Value Date   WBC 7.2 07/17/2020   HGB 16.1 07/17/2020   HCT 46.9 07/17/2020   MCV 96.4 07/17/2020   PLT 214.0 07/17/2020   Lab Results  Component Value Date   NA 137 07/17/2020   K 4.5 07/19/2020   CO2 29 07/17/2020   GLUCOSE 138 (H) 07/17/2020   BUN 17 07/17/2020   CREATININE 1.21 07/17/2020   BILITOT 0.6 07/17/2020   ALKPHOS 70 07/17/2020   AST 19 07/17/2020   ALT 29 07/17/2020   PROT  7.3 07/17/2020   ALBUMIN 4.7 07/17/2020   CALCIUM 9.6 07/17/2020   GFR 63.32 07/17/2020   Lab Results  Component Value Date   CHOL 162 07/17/2020   Lab Results  Component Value Date   HDL 50.80 07/17/2020   Lab Results  Component Value Date   LDLCALC 88 07/17/2020   Lab Results  Component Value Date   TRIG 113.0 07/17/2020   Lab Results  Component Value Date   CHOLHDL 3 07/17/2020   Lab Results  Component Value Date   HGBA1C 6.4 07/17/2020      Assessment & Plan:   Problem List Items Addressed This Visit      Digestive   Fatty liver disease, nonalcoholic   Relevant Orders   Comprehensive metabolic panel   Lipid panel     Endocrine   Type 2 diabetes mellitus without complication, without long-term current use of insulin (HCC)   Relevant Orders   CBC   Comprehensive metabolic panel   Hemoglobin A1c   Urinalysis, Routine w reflex microscopic   Microalbumin / creatinine urine ratio     Other   Healthcare maintenance   Statin intolerance   Elevated cholesterol   Relevant Orders   CBC   Comprehensive metabolic panel   Lipid panel    Other Visit Diagnoses    Need for hepatitis A and B vaccination    -  Primary   Relevant Orders  Tdap vaccine greater than or equal to 7yo IM   Need for Tdap vaccination       Relevant Orders   Hepatitis A vaccine adult IM (Completed)   Hepatitis B vaccine adult IM      No orders of the defined types were placed in this encounter.   Follow-up: Return in about 6 months (around 08/21/2021), or if symptoms worsen or fail to improve.  Patient was given information on health maintenance and disease prevention.  Advised him to get some exercise and would help his stress level and help improve his diabetes control.  Explained to him that sugars are going to be up and down depending on what he eats.  We will make adjustments to his medication regimen attending the results of his hemoglobin A1c.   Libby Maw, MD

## 2021-02-18 NOTE — Patient Instructions (Signed)
Health Maintenance, Male Adopting a healthy lifestyle and getting preventive care are important in promoting health and wellness. Ask your health care provider about:  The right schedule for you to have regular tests and exams.  Things you can do on your own to prevent diseases and keep yourself healthy. What should I know about diet, weight, and exercise? Eat a healthy diet  Eat a diet that includes plenty of vegetables, fruits, low-fat dairy products, and lean protein.  Do not eat a lot of foods that are high in solid fats, added sugars, or sodium.   Maintain a healthy weight Body mass index (BMI) is a measurement that can be used to identify possible weight problems. It estimates body fat based on height and weight. Your health care provider can help determine your BMI and help you achieve or maintain a healthy weight. Get regular exercise Get regular exercise. This is one of the most important things you can do for your health. Most adults should:  Exercise for at least 150 minutes each week. The exercise should increase your heart rate and make you sweat (moderate-intensity exercise).  Do strengthening exercises at least twice a week. This is in addition to the moderate-intensity exercise.  Spend less time sitting. Even light physical activity can be beneficial. Watch cholesterol and blood lipids Have your blood tested for lipids and cholesterol at 51 years of age, then have this test every 5 years. You may need to have your cholesterol levels checked more often if:  Your lipid or cholesterol levels are high.  You are older than 51 years of age.  You are at high risk for heart disease. What should I know about cancer screening? Many types of cancers can be detected early and may often be prevented. Depending on your health history and family history, you may need to have cancer screening at various ages. This may include screening for:  Colorectal cancer.  Prostate  cancer.  Skin cancer.  Lung cancer. What should I know about heart disease, diabetes, and high blood pressure? Blood pressure and heart disease  High blood pressure causes heart disease and increases the risk of stroke. This is more likely to develop in people who have high blood pressure readings, are of African descent, or are overweight.  Talk with your health care provider about your target blood pressure readings.  Have your blood pressure checked: ? Every 3-5 years if you are 18-39 years of age. ? Every year if you are 40 years old or older.  If you are between the ages of 65 and 75 and are a current or former smoker, ask your health care provider if you should have a one-time screening for abdominal aortic aneurysm (AAA). Diabetes Have regular diabetes screenings. This checks your fasting blood sugar level. Have the screening done:  Once every three years after age 45 if you are at a normal weight and have a low risk for diabetes.  More often and at a younger age if you are overweight or have a high risk for diabetes. What should I know about preventing infection? Hepatitis B If you have a higher risk for hepatitis B, you should be screened for this virus. Talk with your health care provider to find out if you are at risk for hepatitis B infection. Hepatitis C Blood testing is recommended for:  Everyone born from 1945 through 1965.  Anyone with known risk factors for hepatitis C. Sexually transmitted infections (STIs)  You should be screened each   year for STIs, including gonorrhea and chlamydia, if: ? You are sexually active and are younger than 51 years of age. ? You are older than 51 years of age and your health care provider tells you that you are at risk for this type of infection. ? Your sexual activity has changed since you were last screened, and you are at increased risk for chlamydia or gonorrhea. Ask your health care provider if you are at risk.  Ask your  health care provider about whether you are at high risk for HIV. Your health care provider may recommend a prescription medicine to help prevent HIV infection. If you choose to take medicine to prevent HIV, you should first get tested for HIV. You should then be tested every 3 months for as long as you are taking the medicine. Follow these instructions at home: Lifestyle  Do not use any products that contain nicotine or tobacco, such as cigarettes, e-cigarettes, and chewing tobacco. If you need help quitting, ask your health care provider.  Do not use street drugs.  Do not share needles.  Ask your health care provider for help if you need support or information about quitting drugs. Alcohol use  Do not drink alcohol if your health care provider tells you not to drink.  If you drink alcohol: ? Limit how much you have to 0-2 drinks a day. ? Be aware of how much alcohol is in your drink. In the [place] one drink equals one 12 oz bottle of beer (355 mL), one 5 oz glass of wine (148 mL), or one 1 oz glass of hard liquor (44 mL). General instructions  Schedule regular health, dental, and eye exams.  Stay current with your vaccines.  Tell your health care provider if: ? You often feel depressed. ? You have ever been abused or do not feel safe at home. Summary  Adopting a healthy lifestyle and getting preventive care are important in promoting health and wellness.  Follow your health care provider's instructions about healthy diet, exercising, and getting tested or screened for diseases.  Follow your health care provider's instructions on monitoring your cholesterol and blood pressure. This information is not intended to replace advice given to you by your health care provider. Make sure you discuss any questions you have with your health care provider. Document Revised: 09/29/2018 Document Reviewed: 09/29/2018 Elsevier Patient Education  2021 [place] Years  Old, Male Preventive care refers to lifestyle choices and visits with your health care provider that can promote health and wellness. This includes:  A yearly physical exam. This is also called an annual wellness visit.  Regular dental and eye exams.  Immunizations.  Screening for certain conditions.  Healthy lifestyle choices, such as: ? Eating a healthy diet. ? Getting regular exercise. ? Not using drugs or products that contain nicotine and tobacco. ? Limiting alcohol use. What can I expect for my preventive care visit? Physical exam Your health care provider will check your:  Height and weight. These may be used to calculate your BMI (body mass index). BMI is a measurement that tells if you are at a healthy weight.  Heart rate and blood pressure.  Body temperature.  Skin for abnormal spots. Counseling Your health care provider may ask you questions about your:  Past medical problems.  Family's medical history.  Alcohol, tobacco, and drug use.  Emotional well-being.  Home life and relationship well-being.  Sexual activity.  Diet, exercise, and sleep habits.  Work and work Statistician.  Access to firearms. What immunizations do I need? Vaccines are usually given at various ages, according to a schedule. Your health care provider will recommend vaccines for you based on your age, medical history, and lifestyle or other factors, such as travel or where you work.   What tests do I need? Blood tests  Lipid and cholesterol levels. These may be checked every 5 years, or more often if you are over 44 years old.  Hepatitis C test.  Hepatitis B test. Screening  Lung cancer screening. You may have this screening every year starting at age 77 if you have a 30-pack-year history of smoking and currently smoke or have quit within the past 15 years.  Prostate cancer screening. Recommendations will vary depending on your family history and other risks.  Genital exam  to check for testicular cancer or hernias.  Colorectal cancer screening. ? All adults should have this screening starting at age 36 and continuing until age 28. ? Your health care provider may recommend screening at age 16 if you are at increased risk. ? You will have tests every 1-10 years, depending on your results and the type of screening test.  Diabetes screening. ? This is done by checking your blood sugar (glucose) after you have not eaten for a while (fasting). ? You may have this done every 1-3 years.  STD (sexually transmitted disease) testing, if you are at risk. Follow these instructions at home: Eating and drinking  Eat a diet that includes fresh fruits and vegetables, whole grains, lean protein, and low-fat dairy products.  Take vitamin and mineral supplements as recommended by your health care provider.  Do not drink alcohol if your health care provider tells you not to drink.  If you drink alcohol: ? Limit how much you have to 0-2 drinks a day. ? Be aware of how much alcohol is in your drink. In the [place] one drink equals one 12 oz bottle of beer (355 mL), one 5 oz glass of wine (148 mL), or one 1 oz glass of hard liquor (44 mL).   Lifestyle  Take daily care of your teeth and gums. Brush your teeth every morning and night with fluoride toothpaste. Floss one time each day.  Stay active. Exercise for at least 30 minutes 5 or more days each week.  Do not use any products that contain nicotine or tobacco, such as cigarettes, e-cigarettes, and chewing tobacco. If you need help quitting, ask your health care provider.  Do not use drugs.  If you are sexually active, practice safe sex. Use a condom or other form of protection to prevent STIs (sexually transmitted infections).  If told by your health care provider, take low-dose aspirin daily starting at age 10.  Find healthy ways to cope with stress, such as: ? Meditation, yoga, or listening to  music. ? Journaling. ? Talking to a trusted person. ? Spending time with friends and family. Safety  Always wear your seat belt while driving or riding in a vehicle.  Do not drive: ? If you have been drinking alcohol. Do not ride with someone who has been drinking. ? When you are tired or distracted. ? While texting.  Wear a helmet and other protective equipment during sports activities.  If you have firearms in your house, make sure you follow all gun safety procedures. What's next?  Go to your health care provider once a year for an annual wellness visit.  Ask your health  care provider how often you should have your eyes and teeth checked.  Stay up to date on all vaccines. This information is not intended to replace advice given to you by your health care provider. Make sure you discuss any questions you have with your health care provider. Document Revised: 07/05/2019 Document Reviewed: 09/30/2018 Elsevier Patient Education  2021 [employer].

## 2021-02-26 ENCOUNTER — Encounter: Payer: Self-pay | Admitting: Family Medicine

## 2021-02-27 ENCOUNTER — Encounter: Payer: Self-pay | Admitting: Family Medicine

## 2021-03-26 ENCOUNTER — Encounter: Payer: Self-pay | Admitting: Family Medicine

## 2021-04-23 ENCOUNTER — Other Ambulatory Visit: Payer: Self-pay | Admitting: Family Medicine

## 2021-04-23 DIAGNOSIS — E119 Type 2 diabetes mellitus without complications: Secondary | ICD-10-CM

## 2021-07-07 ENCOUNTER — Encounter: Payer: Self-pay | Admitting: Family Medicine

## 2021-07-09 ENCOUNTER — Encounter: Payer: Self-pay | Admitting: Family Medicine

## 2021-07-09 ENCOUNTER — Telehealth (INDEPENDENT_AMBULATORY_CARE_PROVIDER_SITE_OTHER): Payer: BC Managed Care – PPO | Admitting: Family Medicine

## 2021-07-09 DIAGNOSIS — U071 COVID-19: Secondary | ICD-10-CM | POA: Diagnosis not present

## 2021-07-09 NOTE — Patient Instructions (Signed)
   ---------------------------------------------------------------------------------------------------------------------------      WORK SLIP:  Patient Joseph Horton,  Mar 08, 1971, was seen for a medical visit today, 07/09/21 . Please excuse from work for a COVID like illness. We advise 10 days minimum from the onset of symptoms (07/05/21) PLUS 1 day of no fever and improved symptoms. Will defer to employer for a sooner return to work if symptoms have resolved, it is greater than 5 days since the positive test and the patient can wear a high-quality, tight fitting mask such as N95 or KN95 at all times for an additional 5 days. Would also suggest COVID19 antigen testing is negative prior to return.  Sincerely: E-signature: Dr. Colin Benton, DO [facility] [place] Ph: [phone]   ------------------------------------------------------------------------------------------------------------------------------   -can use tylenol if needed for fevers, aches and pains per instructions  -can use nasal saline a few times per day if you have nasal congestion  -stay hydrated, drink plenty of fluids and eat small healthy meals - avoid dairy  -can take 1000 IU (58mcg) Vit D3 and 100-500 mg of Vit C daily per instructions  -follow up with your doctor in 2-3 days unless improving and feeling better  -stay home while sick, except to seek medical care. If you have COVID19, ideally it would be best to stay home for a full 10 days since the onset of symptoms PLUS one day of no fever and feeling better. Wear a good mask that fits snugly (such as N95 or KN95) if around others to reduce the risk of transmission.  It was nice to meet you today, and I really hope you are feeling better soon. I help [place] out with telemedicine visits on Tuesdays and Thursdays and am available for visits on those days. If you have any concerns or questions following this visit please schedule a follow up visit  with your Primary Care doctor or seek care at a local urgent care clinic to avoid delays in care.    Seek in person care or schedule a follow up video visit promptly if your symptoms worsen, new concerns arise or you are not improving with treatment. Call 911 and/or seek emergency care if your symptoms are severe or life threatening.

## 2021-07-09 NOTE — Progress Notes (Signed)
Virtual Visit via Video Note  I connected with [place]  on 07/09/21 at 10:40 AM EDT by a video enabled telemedicine application and verified that I am speaking with the correct person using two identifiers.  Location patient: home, [place] Location provider:work or home office Persons participating in the virtual visit: patient, provider  I discussed the limitations of evaluation and management by telemedicine and the availability of in person appointments. The patient expressed understanding and agreed to proceed.   HPI:  Acute telemedicine visit for : -Onset: started 4 days ago; tested positive 2 days ago -Symptoms include:nasal congestion, ear feels full, mild cough initially, sore throat, HA initially, diarrhea -Denies:fever, CP, SOB, NV, inability to eat/drink/get out of bed -Pertinent past medical history:see below -Pertinent medication allergies:  Allergies  Allergen Reactions   Crab [Shellfish Allergy]     Blue crab   Rosuvastatin Other (See Comments)    myalgia myalgia    Statins Other (See Comments)  -COVID-19 vaccine status: 2 doses and 2 boosters -GFR 78 in May  ROS: See pertinent positives and negatives per HPI.  Past Medical History:  Diagnosis Date   History of colon polyps    Hyperlipidemia    Myocardial infarction [facility])    Neuromuscular disorder ([place])    Bell's palsy    Past Surgical History:  Procedure Laterality Date   CARDIAC CATHETERIZATION  05/05/2014   stents placed   COLONOSCOPY     2018     Current Outpatient Medications:    aspirin 81 MG tablet, Take 1 tablet by mouth daily., Disp: , Rfl:    Continuous Blood Gluc Receiver ([address]) DEVI, Use daily. DX: E11.65, Disp: 12 each, Rfl: 3   Continuous Blood Gluc Sensor (DEXCOM G6 SENSOR) MISC, Use to test blood sugar. DX: E11.65, Disp: 12 each, Rfl: 3   Continuous Blood Gluc Transmit (DEXCOM G6 TRANSMITTER) MISC, Use to test blood sugar. DX: E11.6, Disp: 12 each, Rfl: 3   Evolocumab  (REPATHA SURECLICK) 224 MG/ML SOAJ, Inject 1 Syringe into the skin every 14 (fourteen) days., Disp: , Rfl:    JANUMET 50-1000 MG tablet, TAKE 1 TABLET BY MOUTH TWICE DAILY WITH A MEAL, Disp: 180 tablet, Rfl: 1   lisinopril (PRINIVIL,ZESTRIL) 10 MG tablet, Take 10 mg by mouth daily., Disp: , Rfl:    metoprolol succinate (TOPROL-XL) 25 MG 24 hr tablet, Take 1 tablet by mouth daily., Disp: , Rfl:    nitroGLYCERIN (NITROSTAT) 0.4 MG SL tablet, Place 1 tablet under the tongue every 5 minutes as needed for up to 30 days for chest pain., Disp: , Rfl:    Omega-3 1000 MG CAPS, Take 1 capsule by mouth daily., Disp: , Rfl:    pantoprazole (PROTONIX) 40 MG tablet, Take 1 tablet (40 mg total) by mouth 2 (two) times daily., Disp: 90 tablet, Rfl: 3  EXAM:  VITALS per patient if applicable:  GENERAL: alert, oriented, appears well and in no acute distress  HEENT: atraumatic, conjunttiva clear, no obvious abnormalities on inspection of external nose and ears  NECK: normal movements of the head and neck  LUNGS: on inspection no signs of respiratory distress, breathing rate appears normal, no obvious gross SOB, gasping or wheezing  CV: no obvious cyanosis  MS: moves all visible extremities without noticeable abnormality  PSYCH/NEURO: pleasant and cooperative, no obvious depression or anxiety, speech and thought processing grossly intact  ASSESSMENT AND PLAN:  Discussed the following assessment and plan:  COVID-19   Discussed treatment options (infusions  and oral options and risk of drug interactions), ideal treatment window, potential complications, isolation and precautions for COVID-19.  Discussed possibility of rebound with antivirals and the need to reisolate if it should occur for 5 days. Checked for/reviewed any labs done in the last 90 days with GFR listed in HPI if available. The patient declined referral for Covid outpatient treatment at this time.  Symptomatic care measures summarized in  patient instructions. Work/School slipped offered: provided in patient instructions   Advised to seek prompt in person care if worsening, new symptoms arise, or if is not improving with treatment. Discussed options for inperson care if PCP office not available. Did let this patient know that I only do telemedicine on Tuesdays and Thursdays for [place]. Advised to schedule follow up visit with PCP or [facility] if any further questions or concerns to avoid delays in care.   I discussed the assessment and treatment plan with the patient. The patient was provided an opportunity to ask questions and all were answered. The patient agreed with the plan and demonstrated an understanding of the instructions.     Lucretia Kern, DO

## 2021-07-28 ENCOUNTER — Other Ambulatory Visit: Payer: Self-pay | Admitting: Family Medicine

## 2021-08-28 ENCOUNTER — Other Ambulatory Visit: Payer: Self-pay | Admitting: Family Medicine

## 2021-10-19 ENCOUNTER — Other Ambulatory Visit: Payer: Self-pay | Admitting: Family Medicine

## 2021-10-19 DIAGNOSIS — E119 Type 2 diabetes mellitus without complications: Secondary | ICD-10-CM

## 2021-10-22 ENCOUNTER — Telehealth: Payer: Self-pay | Admitting: Family Medicine

## 2021-10-22 NOTE — Telephone Encounter (Signed)
Pt requesting refill on [place]

## 2021-10-23 ENCOUNTER — Other Ambulatory Visit: Payer: Self-pay

## 2021-10-23 DIAGNOSIS — E119 Type 2 diabetes mellitus without complications: Secondary | ICD-10-CM

## 2021-10-23 MED ORDER — REPATHA SURECLICK 140 MG/ML ~~LOC~~ SOAJ: 1.0000 | SUBCUTANEOUS | 3 refills | Status: DC

## 2021-10-23 NOTE — Telephone Encounter (Signed)
Refill sent in

## 2021-11-20 ENCOUNTER — Other Ambulatory Visit: Payer: Self-pay

## 2021-11-21 ENCOUNTER — Encounter: Payer: Self-pay | Admitting: Family Medicine

## 2021-11-21 ENCOUNTER — Encounter: Payer: Self-pay | Admitting: Gastroenterology

## 2021-11-21 ENCOUNTER — Ambulatory Visit: Payer: BC Managed Care – PPO | Admitting: Gastroenterology

## 2021-11-21 ENCOUNTER — Ambulatory Visit (INDEPENDENT_AMBULATORY_CARE_PROVIDER_SITE_OTHER): Payer: BC Managed Care – PPO | Admitting: Family Medicine

## 2021-11-21 VITALS — BP 124/78 | HR 88 | Ht 64.0 in | Wt 168.0 lb

## 2021-11-21 VITALS — BP 128/80 | HR 74 | Temp 97.5°F | Ht 64.0 in | Wt 166.4 lb

## 2021-11-21 DIAGNOSIS — E7800 Pure hypercholesterolemia, unspecified: Secondary | ICD-10-CM

## 2021-11-21 DIAGNOSIS — K7689 Other specified diseases of liver: Secondary | ICD-10-CM | POA: Diagnosis not present

## 2021-11-21 DIAGNOSIS — Z23 Encounter for immunization: Secondary | ICD-10-CM

## 2021-11-21 DIAGNOSIS — R7401 Elevation of levels of liver transaminase levels: Secondary | ICD-10-CM

## 2021-11-21 DIAGNOSIS — K219 Gastro-esophageal reflux disease without esophagitis: Secondary | ICD-10-CM | POA: Diagnosis not present

## 2021-11-21 DIAGNOSIS — Z86010 Personal history of colonic polyps: Secondary | ICD-10-CM

## 2021-11-21 DIAGNOSIS — K760 Fatty (change of) liver, not elsewhere classified: Secondary | ICD-10-CM

## 2021-11-21 DIAGNOSIS — E119 Type 2 diabetes mellitus without complications: Secondary | ICD-10-CM | POA: Diagnosis not present

## 2021-11-21 LAB — COMPREHENSIVE METABOLIC PANEL
ALT: 57 U/L — ABNORMAL HIGH (ref 0–53)
AST: 26 U/L (ref 0–37)
Albumin: 4.7 g/dL (ref 3.5–5.2)
Alkaline Phosphatase: 96 U/L (ref 39–117)
BUN: 14 mg/dL (ref 6–23)
CO2: 27 mEq/L (ref 19–32)
Calcium: 9.6 mg/dL (ref 8.4–10.5)
Chloride: 100 mEq/L (ref 96–112)
Creatinine, Ser: 1.17 mg/dL (ref 0.40–1.50)
GFR: 71.94 mL/min (ref >60.00)
Glucose, Bld: 136 mg/dL — ABNORMAL HIGH (ref 70–99)
Potassium: 4.5 mEq/L (ref 3.5–5.1)
Sodium: 136 mEq/L (ref 135–145)
Total Bilirubin: 0.8 mg/dL (ref 0.2–1.2)
Total Protein: 7.5 g/dL (ref 6.0–8.3)

## 2021-11-21 LAB — LDL CHOLESTEROL, DIRECT: Direct LDL: 61 mg/dL

## 2021-11-21 LAB — LIPID PANEL
Cholesterol: 126 mg/dL (ref 0–200)
HDL: 49.2 mg/dL (ref >39.00)
LDL Cholesterol: 40 mg/dL (ref 0–99)
NonHDL: 77.18
Total CHOL/HDL Ratio: 3
Triglycerides: 187 mg/dL — ABNORMAL HIGH (ref 0.0–149.0)
VLDL: 37.4 mg/dL (ref 0.0–40.0)

## 2021-11-21 LAB — CBC
HCT: 49.7 % (ref 39.0–52.0)
Hemoglobin: 16.8 g/dL (ref 13.0–17.0)
MCHC: 33.8 g/dL (ref 30.0–36.0)
MCV: 95.2 fl (ref 78.0–100.0)
Platelets: 253 10*3/uL (ref 150.0–400.0)
RBC: 5.22 Mil/uL (ref 4.22–5.81)
RDW: 12.7 % (ref 11.5–15.5)
WBC: 8 10*3/uL (ref 4.0–10.5)

## 2021-11-21 LAB — HEMOGLOBIN A1C: Hgb A1c MFr Bld: 7.1 % — ABNORMAL HIGH (ref 4.6–6.5)

## 2021-11-21 NOTE — Patient Instructions (Signed)
If you are age 52 or older, your body mass index should be between 23-30. Your Body mass index is 28.84 kg/m. If this is out of the aforementioned range listed, please consider follow up with your Primary Care Provider.  If you are age 64 or younger, your body mass index should be between 19-25. Your Body mass index is 28.84 kg/m. If this is out of the aformentioned range listed, please consider follow up with your Primary Care Provider.   __________________________________________________________  The [facility] providers would like to encourage you to use [facility] to communicate with providers for non-urgent requests or questions.  Due to long hold times on the telephone, sending your provider a message by [facility] may be a faster and more efficient way to get a response.  Please allow 48 business hours for a response.  Please remember that this is for non-urgent requests.    Due to recent changes in healthcare laws, you may see the results of your imaging and laboratory studies on MyChart before your provider has had a chance to review them.  We understand that in some cases there may be results that are confusing or concerning to you. Not all laboratory results come back in the same time frame and the provider may be waiting for multiple results in order to interpret others.  Please give [place] 48 hours in order for your provider to thoroughly review all the results before contacting the office for clarification of your results.    Thank you for choosing me and [facility].  Vito Cirigliano, D.O.

## 2021-11-21 NOTE — Progress Notes (Signed)
Established Patient Office Visit  Subjective:  Patient ID: Joseph Horton, male    DOB: 1971/02/11  Age: 51 y.o. MRN: [id number]  CC:  Chief Complaint  Patient presents with   Annual Exam    CPE, no concerns. Patient fasting for labs.     HPI Joseph Horton presents for follow-up of diabetes, elevated cholesterol and hypertension and need for Shingrix and influenza vaccines.  Zetia.  He has adjusted his diet to deemphasize red meat.  He is exercising is much as possible weather.  More aware of glucose excursions with his continuous readout.-Exercise consuming a high carbohydrate load.  Things seem to be normalizing  Past Medical History:  Diagnosis Date   History of colon polyps    Hyperlipidemia    Myocardial infarction ([place])    Neuromuscular disorder ([place])    Bell's palsy    Past Surgical History:  Procedure Laterality Date   CARDIAC CATHETERIZATION  05/05/2014   stents placed   COLONOSCOPY     2018    Family History  Problem Relation Age of Onset   Asthma Mother    Hyperlipidemia Mother    Colon polyps Mother    Heart disease Father    Esophageal cancer Neg Hx    Colon cancer Neg Hx    Rectal cancer Neg Hx    Stomach cancer Neg Hx     Social History   Socioeconomic History   Marital status: Significant Other    Spouse name: Not on file   Number of children: Not on file   Years of education: Not on file   Highest education level: Not on file  Occupational History   Not on file  Tobacco Use   Smoking status: Never   Smokeless tobacco: Never  Vaping Use   Vaping Use: Never used  Substance and Sexual Activity   Alcohol use: Yes    Comment: 2 glasses of wine per week.   Drug use: Never   Sexual activity: Not on file  Other Topics Concern   Not on file  Social History Narrative   Not on file   Social Determinants of Health   Financial Resource Strain: Not on file  Food Insecurity: Not on file  Transportation Needs: Not on file  Physical Activity:  Not on file  Stress: Not on file  Social Connections: Not on file  Intimate Partner Violence: Not on file    Outpatient Medications Prior to Visit  Medication Sig Dispense Refill   aspirin 81 MG tablet Take 1 tablet by mouth daily.     Continuous Blood Gluc Receiver (DEXCOM G6 RECEIVER) DEVI Use daily. DX: E11.65 12 each 3   Continuous Blood Gluc Sensor (DEXCOM G6 SENSOR) MISC USE TO TEST BLOOD SUGAR 12 each 3   Continuous Blood Gluc Transmit (DEXCOM G6 TRANSMITTER) MISC USE TO TEST BLOOD SUGAR 12 each 3   Evolocumab (REPATHA SURECLICK) 621 MG/ML SOAJ Inject 1 Syringe into the skin every 14 (fourteen) days. 2 mL 3   ezetimibe (ZETIA) 10 MG tablet Take 10 mg by mouth daily.     JANUMET 50-1000 MG tablet TAKE 1 TABLET BY MOUTH TWICE DAILY WITH A MEAL 180 tablet 0   lisinopril (PRINIVIL,ZESTRIL) 10 MG tablet Take 10 mg by mouth daily.     metoprolol succinate (TOPROL-XL) 25 MG 24 hr tablet Take 1 tablet by mouth daily.     nitroGLYCERIN (NITROSTAT) 0.4 MG SL tablet Place 1 tablet under the tongue every 5 minutes as needed  for up to 30 days for chest pain.     Omega-3 1000 MG CAPS Take 1 capsule by mouth daily.     pantoprazole (PROTONIX) 40 MG tablet Take 1 tablet (40 mg total) by mouth 2 (two) times daily. (Patient not taking: Reported on 11/21/2021) 90 tablet 3   No facility-administered medications prior to visit.    Allergies  Allergen Reactions   Crab [Shellfish Allergy]     Blue crab   Rosuvastatin Other (See Comments)    myalgia myalgia    Statins Other (See Comments)    ROS Review of Systems  Constitutional:  Negative for chills, diaphoresis, fatigue, fever and unexpected weight change.  HENT: Negative.    Eyes:  Negative for photophobia and visual disturbance.  Respiratory: Negative.    Cardiovascular: Negative.   Gastrointestinal: Negative.   Endocrine: Negative for polyphagia and polyuria.  Genitourinary:  Negative for difficulty urinating, frequency and urgency.   Musculoskeletal:  Negative for gait problem and joint swelling.  Neurological:  Negative for speech difficulty and weakness.  Psychiatric/Behavioral: Negative.       Objective:    Physical Exam Vitals and nursing note reviewed.  Constitutional:      General: He is not in acute distress.    Appearance: Normal appearance. He is not ill-appearing, toxic-appearing or diaphoretic.  HENT:     Head: Normocephalic and atraumatic.     Right Ear: External ear normal.     Left Ear: External ear normal.     Mouth/Throat:     Mouth: Mucous membranes are moist.     Pharynx: Oropharynx is clear. No oropharyngeal exudate or posterior oropharyngeal erythema.  Eyes:     General: No scleral icterus.       Right eye: No discharge.        Left eye: No discharge.     Extraocular Movements: Extraocular movements intact.     Conjunctiva/sclera: Conjunctivae normal.     Pupils: Pupils are equal, round, and reactive to light.  Neck:     Vascular: No carotid bruit.  Cardiovascular:     Rate and Rhythm: Normal rate and regular rhythm.  Pulmonary:     Effort: Pulmonary effort is normal.     Breath sounds: Normal breath sounds.  Abdominal:     General: Bowel sounds are normal.  Musculoskeletal:     Cervical back: No rigidity or tenderness.     Right lower leg: No edema.     Left lower leg: No edema.  Lymphadenopathy:     Cervical: No cervical adenopathy.  Skin:    General: Skin is warm and dry.  Neurological:     Mental Status: He is alert and oriented to person, place, and time.  Psychiatric:        Mood and Affect: Mood normal.        Behavior: Behavior normal.    BP 128/80 (BP Location: Right Arm, Patient Position: Sitting, Cuff Size: Normal)    Pulse 74    Temp (!) 97.5 F (36.4 C) (Temporal)    Ht 5\' 4"  (1.626 m)    Wt 166 lb 6.4 oz (75.5 kg)    SpO2 98%    BMI 28.56 kg/m  Wt Readings from Last 3 Encounters:  11/21/21 166 lb 6.4 oz (75.5 kg)  02/18/21 168 lb 12.8 oz (76.6 kg)   07/17/20 165 lb 12.8 oz (75.2 kg)     Health Maintenance Due  Topic Date Due   HIV Screening  Never  done   Zoster Vaccines- Shingrix (1 of 2) Never done   INFLUENZA VACCINE  05/20/2021   HEMOGLOBIN A1C  08/21/2021    There are no preventive care reminders to display for this patient.  No results found for: TSH Lab Results  Component Value Date   WBC 6.1 02/18/2021   HGB 15.5 02/18/2021   HCT 45.2 02/18/2021   MCV 95.1 02/18/2021   PLT 236.0 02/18/2021   Lab Results  Component Value Date   NA 136 02/18/2021   K 4.4 02/18/2021   CO2 26 02/18/2021   GLUCOSE 170 (H) 02/18/2021   BUN 11 02/18/2021   CREATININE 1.09 02/18/2021   BILITOT 0.6 02/18/2021   ALKPHOS 98 02/18/2021   AST 33 02/18/2021   ALT 69 (H) 02/18/2021   PROT 6.8 02/18/2021   ALBUMIN 4.2 02/18/2021   CALCIUM 9.0 02/18/2021   GFR 78.74 02/18/2021   Lab Results  Component Value Date   CHOL 167 02/18/2021   Lab Results  Component Value Date   HDL 46.30 02/18/2021   Lab Results  Component Value Date   LDLCALC 90 02/18/2021   Lab Results  Component Value Date   TRIG 152.0 (H) 02/18/2021   Lab Results  Component Value Date   CHOLHDL 4 02/18/2021   Lab Results  Component Value Date   HGBA1C 6.7 (H) 02/18/2021      Assessment & Plan:   Problem List Items Addressed This Visit       Digestive   Fatty liver disease, nonalcoholic   Relevant Orders   Comprehensive metabolic panel     Endocrine   Type 2 diabetes mellitus without complication, without long-term current use of insulin (HCC)   Relevant Orders   CBC   Comprehensive metabolic panel   Hemoglobin A1c     Other   Need for influenza vaccination   Relevant Orders   Flu Vaccine QUAD 6+ mos PF IM (Fluarix Quad PF)   Elevated cholesterol - Primary   Relevant Medications   ezetimibe (ZETIA) 10 MG tablet   Other Relevant Orders   Comprehensive metabolic panel   Lipid panel   LDL cholesterol, direct   Need for shingles  vaccine   Relevant Orders   Varicella-zoster vaccine IM (Shingrix)    No orders of the defined types were placed in this encounter.   Follow-up: Return in about 6 months (around 05/21/2022), or if symptoms worsen or fail to improve.   Information supplied about preventing high cholesterol.  You exercising and maintaining weight also fatty liver disease.  Information given about the shingles experiencing Libby Maw, MD

## 2021-11-21 NOTE — Progress Notes (Signed)
Chief Complaint:    GERD  GI History: Joseph Horton is a 51 y.o. male with a hx of CAD w/ MI in 2015 s/p PCI w/ 2 stents placed in right coronary artery, DM (A1c 6.4), HLD, who follows in the GI clinic for the following:   1) History of colon polyps: Colonoscopy was in 2018 with [facility] which was notable for polyps, with recommendation to repeat in 2 years (unclear reason; never received records for review).   -Colonoscopy (09/2019, Dr. Bryan Lemma): 2 polyps, 3-4 mm (path: TA x1, HP x1),internal hemorhroids. Repeat 5 years   2) Fatty liver disease/elevated ALT: Previously evaluated by [facility] 2019, to include CT as outlined below.  Very mildly elevated liver enzymes in 2018, with reported improvement in 2019 (records requested, not received).  Was previously drinking approximately 10 drinks/week (wine/beer), but stopped since being told about liver issues.  Improved BMI from 29 to 28 with increased exercise/activity and healthy eating.   -04/2017 with AST/ALT 33/45, ALP 79, T bili 0.8 with normal albumin -08/2019 AST/ALT 22/45, ALP 90 -CT abdomen (10/2017) with ?fine liver surface irregularity and slight hypertrophy of lateral left lobe, unable to exclude early cirrhosis.  0.5 cm hypodensity in the inferior right liver lobe.  Normal ducts, normal pancreas, spleen.  Radiologist recommended hepatic elastography along with MRI three-phase in 3 to 6 months for the small hypodensity. -Interestingly, he even sought a 2nd opinion with another GI in [place] (where he is from) in 2019, to include MRI liver: -01/11/2018, MRI liver: 5 mm hepatic cyst in segment 6, with otherwise normal intrahepatic ducts.  Normal CBD at 4 mm.  No masses. Otherwise normal liver size, contour. -01/13/20: AST/ALT 21/30, normal ALP, TBili. GGT 149 -02/03/20: AST/ALT 29/47, normal ALP, TBili. GGT 195 -02/07/2020, MRI abdomen: 6 mm benign appearing cyst anterior lateral right hepatic lobe, otherwise normal liver.  Tiny  bilateral simple renal cysts.  Otherwise normal - 01/2021: Evaluated at [facility] for presumed NAFLD.  Plan for 69-month follow-up with FibroScan at that time.  3) GERD with erosive esophagitis: Index symptoms of heartburn, regurgitation.  Worse with spicy foods. - EGD (03/2020):LA Grade A esophagitis, Hill grade 3 valve.  Fundic gland polyps, mild non-H. pylori gastritis.  3 nonbleeding superficial duodenal ulcers (path: Benign).  Normal D2.  Started Protonix 40 mg p.o. twice daily  HPI:     Patient is a 51 y.o. male presenting to the Gastroenterology Clinic for follow-up.  Last seen by me on 02/18/2020.  Main issue at that time was progressive reflux symptoms.  Elected for EGD prior to trial of acid suppression medications.  EGD in 03/2020 with LA grade a esophagitis and Hill grade 3 valve.  Was started on Protonix 40 mg p.o. twice daily.  Reflux was well controlled with Protonix.  He titrated down to daily dosing, then stop taking ~6 months ago when he ran out and did not call in for refills.  Reflux was still well controlled off PPI, using Tums approximately once every 2 weeks for heartburn, mostly related to dietary indiscretions.  Had presumed viral gastroenteritis in December when visiting family in [place] (nausea/vomiting/diarrhea, fever).  Reflux symptoms worsened during that episode.  Gastroenteritis symptoms have since resolved, and reflux improved over the last 2 weeks or so with dietary modifications.  Did not use any PPI or H2 blockers just Tums on demand.  Now back to feeling at baseline again.  Tolerating all p.o. intake.  Labs from  01/2021 reviewed: - AST/ALT 41/87, otherwise normal LFTs - HBsAb-, HBcAb-, HAV Ab-  Labs from 02/2021 reviewed: - Normal CBC, A1c 6.7% - AST/ALT 33/69, otherwise normal CMP   Review of systems:     No chest pain, no SOB, no fevers, no urinary sx   Past Medical History:  Diagnosis Date   History of colon polyps    Hyperlipidemia     Myocardial infarction [facility])    Neuromuscular disorder ([place])    Bell's palsy    Patient's surgical history, family medical history, social history, medications and allergies were all reviewed in Epic    Current Outpatient Medications  Medication Sig Dispense Refill   aspirin 81 MG tablet Take 1 tablet by mouth daily.     Continuous Blood Gluc Receiver (DEXCOM G6 RECEIVER) DEVI Use daily. DX: E11.65 12 each 3   Continuous Blood Gluc Sensor (DEXCOM G6 SENSOR) MISC USE TO TEST BLOOD SUGAR 12 each 3   Continuous Blood Gluc Transmit (DEXCOM G6 TRANSMITTER) MISC USE TO TEST BLOOD SUGAR 12 each 3   Evolocumab (REPATHA SURECLICK) 992 MG/ML SOAJ Inject 1 Syringe into the skin every 14 (fourteen) days. 2 mL 3   ezetimibe (ZETIA) 10 MG tablet Take 10 mg by mouth daily.     JANUMET 50-1000 MG tablet TAKE 1 TABLET BY MOUTH TWICE DAILY WITH A MEAL 180 tablet 0   lisinopril (PRINIVIL,ZESTRIL) 10 MG tablet Take 10 mg by mouth daily.     metoprolol succinate (TOPROL-XL) 25 MG 24 hr tablet Take 1 tablet by mouth daily.     nitroGLYCERIN (NITROSTAT) 0.4 MG SL tablet Place 1 tablet under the tongue every 5 minutes as needed for up to 30 days for chest pain.     Omega-3 1000 MG CAPS Take 1 capsule by mouth daily.     pantoprazole (PROTONIX) 40 MG tablet Take 1 tablet (40 mg total) by mouth 2 (two) times daily. (Patient not taking: Reported on 11/21/2021) 90 tablet 3   No current facility-administered medications for this visit.    Physical Exam:     BP 124/78    Pulse 88    Ht 5\' 4"  (1.626 m)    Wt 168 lb (76.2 kg)    SpO2 98%    BMI 28.84 kg/m   GENERAL:  Pleasant male in NAD PSYCH: : Cooperative, normal affect EENT:  conjunctiva pink, mucous membranes moist CARDIAC:  RRR, no murmur heard, no peripheral edema PULM: Normal respiratory effort, lungs CTA bilaterally, no wheezing ABDOMEN:  Nondistended, soft, nontender. No obvious masses, no hepatomegaly,  normal bowel sounds SKIN:  turgor, no lesions  seen Musculoskeletal:  Normal muscle tone, normal strength NEURO: Alert and oriented x 3, no focal neurologic deficits   IMPRESSION and PLAN:    1) GERD - Reflux symptoms now well controlled with dietary modifications and antireflux lifestyle changes - Okay to use Tums on demand - If increasing use/requirement of antacids, will plan to restart Protonix - Advised him to pick up OTC Prilosec or Pepcid prior to any travel in case he has breakthrough reflux symptoms while abroad  2) Hepatic Cyst: -Benign-appearing hepatic cyst which is largely stable in size (5 mm vs 6 mm) on serial imaging studies -No further work-up needed   3) Elevated ALT 4) NAFLD - Follows in the [facility] f/u appointment in [facility] next month as scheduled  5) History of colon polyps - Repeat colonoscopy in 2025 for ongoing polyp surveillance  RTC prn  I spent 25 minutes of time, including independent review of results as outlined above, communicating results with the patient directly, face-to-face time with the patient, coordinating care, ordering studies and medications as appropriate, and documentation.         Lavena Bullion ,DO, FACG 11/21/2021, 10:43 AM

## 2021-11-22 ENCOUNTER — Encounter: Payer: Self-pay | Admitting: Family Medicine

## 2021-11-22 DIAGNOSIS — E119 Type 2 diabetes mellitus without complications: Secondary | ICD-10-CM

## 2021-12-20 ENCOUNTER — Other Ambulatory Visit: Payer: Self-pay | Admitting: Family Medicine

## 2021-12-23 MED ORDER — DEXCOM G7 SENSOR MISC: 1.0000 [IU] | 3 refills | Status: DC

## 2021-12-23 MED ORDER — DEXCOM G7 RECEIVER DEVI: 1.0000 [IU] | Freq: Every day | 5 refills | Status: AC

## 2021-12-23 MED ORDER — JANUMET 50-1000 MG PO TABS: 1.0000 | ORAL_TABLET | Freq: Two times a day (BID) | ORAL | 0 refills | Status: DC

## 2021-12-30 ENCOUNTER — Telehealth: Payer: Self-pay | Admitting: Family Medicine

## 2021-12-31 ENCOUNTER — Telehealth: Payer: Self-pay | Admitting: Family Medicine

## 2021-12-31 NOTE — Telephone Encounter (Signed)
[facility] called to clarify directions on the Dexcom G7... Michela Pitcher this is the final attempt to contact us about this ( I don't see any other attempts) .  ? ?CB : [phone] ?Ref #: [id number] ?

## 2022-01-01 NOTE — Telephone Encounter (Signed)
Called [facility] went over directions for Dexcom 7 ?

## 2022-01-16 ENCOUNTER — Encounter: Payer: Self-pay | Admitting: Family Medicine

## 2022-01-16 NOTE — Telephone Encounter (Signed)
Please advise message below, patient calling to see if Janumet could be changed to Ozempic or Trulicity? This was recommended by a provider that patient seen for virtual through his place of employment.  ?

## 2022-01-17 ENCOUNTER — Encounter: Payer: Self-pay | Admitting: Family Medicine

## 2022-01-17 ENCOUNTER — Telehealth: Payer: BC Managed Care – PPO | Admitting: Family Medicine

## 2022-01-17 VITALS — Ht 64.0 in | Wt 166.0 lb

## 2022-01-17 DIAGNOSIS — E119 Type 2 diabetes mellitus without complications: Secondary | ICD-10-CM | POA: Diagnosis not present

## 2022-01-17 MED ORDER — METFORMIN HCL 1000 MG PO TABS: 1000.0000 mg | ORAL_TABLET | Freq: Two times a day (BID) | ORAL | 3 refills | Status: DC

## 2022-01-17 MED ORDER — SEMAGLUTIDE (1 MG/DOSE) 4 MG/3ML ~~LOC~~ SOPN: 1.0000 mg | PEN_INJECTOR | SUBCUTANEOUS | 1 refills | Status: DC

## 2022-01-17 NOTE — Progress Notes (Signed)
? ?Established Patient Office Visit ? ?Subjective:  ?Patient ID: Joseph Horton, male    DOB: 1971-07-31  Age: 51 y.o. MRN: [id number] ? ?CC:  ?Chief Complaint  ?Patient presents with  ? Advice Only  ?  Discuss diabetes and changing medication.   ? ? ?HPI ?Joseph Horton presents for follow-up of his diabetes.  Continues to have difficulty with controlling his blood sugars.  His insurance company is no longer covering the [place].  He is company provided him consultation with an endocrinologist who suggested a GLP-1 agonist.  We are meeting today to discuss that possibility.  Patient has no history of thyroid cancer.  His father does have a history of thyroid cancer and it was treated successfully.  He is not certain as to the exact type of thyroid cancer his father had contracted.  Continues to be difficult for him to exercise as much as he would like with his work hours ? ?Past Medical History:  ?Diagnosis Date  ? History of colon polyps   ? Hyperlipidemia   ? Myocardial infarction [facility])   ? Neuromuscular disorder ([place])   ? Bell's palsy  ? ? ?Past Surgical History:  ?Procedure Laterality Date  ? CARDIAC CATHETERIZATION  05/05/2014  ? stents placed  ? COLONOSCOPY    ? 2018  ? ? ?Family History  ?Problem Relation Age of Onset  ? Asthma Mother   ? Hyperlipidemia Mother   ? Colon polyps Mother   ? Heart disease Father   ? Esophageal cancer Neg Hx   ? Colon cancer Neg Hx   ? Rectal cancer Neg Hx   ? Stomach cancer Neg Hx   ? ? ?Social History  ? ?Socioeconomic History  ? Marital status: Significant Other  ?  Spouse name: Not on file  ? Number of children: Not on file  ? Years of education: Not on file  ? Highest education level: Not on file  ?Occupational History  ? Not on file  ?Tobacco Use  ? Smoking status: Never  ? Smokeless tobacco: Never  ?Vaping Use  ? Vaping Use: Never used  ?Substance and Sexual Activity  ? Alcohol use: Yes  ?  Comment: occ 2 glasses of wine  ? Drug use: Never  ? Sexual activity: Not on file   ?Other Topics Concern  ? Not on file  ?Social History Narrative  ? Not on file  ? ?Social Determinants of Health  ? ?Financial Resource Strain: Not on file  ?Food Insecurity: Not on file  ?Transportation Needs: Not on file  ?Physical Activity: Not on file  ?Stress: Not on file  ?Social Connections: Not on file  ?Intimate Partner Violence: Not on file  ? ? ?Outpatient Medications Prior to Visit  ?Medication Sig Dispense Refill  ? aspirin 81 MG tablet Take 1 tablet by mouth daily.    ? Continuous Blood Gluc Receiver (DEXCOM G7 RECEIVER) DEVI 1 Units by Does not apply route daily. 1 each 5  ? Continuous Blood Gluc Transmit (DEXCOM G6 TRANSMITTER) MISC USE TO TEST BLOOD SUGAR 12 each 3  ? Evolocumab (REPATHA SURECLICK) 409 MG/ML SOAJ Inject 1 Syringe into the skin every 14 (fourteen) days. 2 mL 3  ? ezetimibe (ZETIA) 10 MG tablet Take 10 mg by mouth daily.    ? lisinopril (PRINIVIL,ZESTRIL) 10 MG tablet Take 10 mg by mouth daily.    ? metoprolol succinate (TOPROL-XL) 25 MG 24 hr tablet Take 1 tablet by mouth daily.    ? nitroGLYCERIN (NITROSTAT)  0.4 MG SL tablet Place 1 tablet under the tongue every 5 minutes as needed for up to 30 days for chest pain.    ? Omega-3 1000 MG CAPS Take 1 capsule by mouth daily.    ? sitaGLIPtin-metformin (JANUMET) 50-1000 MG tablet Take 1 tablet by mouth 2 (two) times daily with a meal. 180 tablet 0  ? pantoprazole (PROTONIX) 40 MG tablet Take 1 tablet (40 mg total) by mouth 2 (two) times daily. (Patient not taking: Reported on 11/21/2021) 90 tablet 3  ? ?No facility-administered medications prior to visit.  ? ? ?Allergies  ?Allergen Reactions  ? Crab [Shellfish Allergy]   ?  Blue crab  ? Rosuvastatin Other (See Comments)  ?  myalgia ?myalgia ?  ? Statins Other (See Comments)  ? ? ?ROS ?Review of Systems  ?Constitutional:  Negative for chills, diaphoresis, fatigue, fever and unexpected weight change.  ?HENT: Negative.    ?Respiratory: Negative.    ?Cardiovascular: Negative.    ?Gastrointestinal: Negative.   ?Endocrine: Negative for polyphagia and polyuria.  ?Psychiatric/Behavioral: Negative.    ? ?  ?Objective:  ?  ?Physical Exam ?Vitals and nursing note reviewed.  ?Constitutional:   ?   General: He is not in acute distress. ?   Appearance: Normal appearance. He is obese. He is not ill-appearing, toxic-appearing or diaphoretic.  ?HENT:  ?   Head: Normocephalic and atraumatic.  ?   Right Ear: External ear normal.  ?   Left Ear: External ear normal.  ?Eyes:  ?   General: No scleral icterus.    ?   Right eye: No discharge.     ?   Left eye: No discharge.  ?   Extraocular Movements: Extraocular movements intact.  ?   Conjunctiva/sclera: Conjunctivae normal.  ?Pulmonary:  ?   Effort: Pulmonary effort is normal.  ?Skin: ?   General: Skin is warm and dry.  ?Neurological:  ?   Mental Status: He is alert and oriented to person, place, and time.  ?Psychiatric:     ?   Mood and Affect: Mood normal.     ?   Behavior: Behavior normal.  ? ? ?Ht '5\' 4"'$  (1.626 m)   Wt 166 lb (75.3 kg)   BMI 28.49 kg/m?  ?Wt Readings from Last 3 Encounters:  ?01/17/22 166 lb (75.3 kg)  ?11/21/21 168 lb (76.2 kg)  ?11/21/21 166 lb 6.4 oz (75.5 kg)  ? ? ? ?Health Maintenance Due  ?Topic Date Due  ? HIV Screening  Never done  ? OPHTHALMOLOGY EXAM  12/28/2021  ? ? ?There are no preventive care reminders to display for this patient. ? ?No results found for: TSH ?Lab Results  ?Component Value Date  ? WBC 8.0 11/21/2021  ? HGB 16.8 11/21/2021  ? HCT 49.7 11/21/2021  ? MCV 95.2 11/21/2021  ? PLT 253.0 11/21/2021  ? ?Lab Results  ?Component Value Date  ? NA 136 11/21/2021  ? K 4.5 11/21/2021  ? CO2 27 11/21/2021  ? GLUCOSE 136 (H) 11/21/2021  ? BUN 14 11/21/2021  ? CREATININE 1.17 11/21/2021  ? BILITOT 0.8 11/21/2021  ? ALKPHOS 96 11/21/2021  ? AST 26 11/21/2021  ? ALT 57 (H) 11/21/2021  ? PROT 7.5 11/21/2021  ? ALBUMIN 4.7 11/21/2021  ? CALCIUM 9.6 11/21/2021  ? GFR 71.94 11/21/2021  ? ?Lab Results  ?Component Value Date  ?  CHOL 126 11/21/2021  ? ?Lab Results  ?Component Value Date  ? HDL 49.20 11/21/2021  ? ?Lab  Results  ?Component Value Date  ? Snead 40 11/21/2021  ? ?Lab Results  ?Component Value Date  ? TRIG 187.0 (H) 11/21/2021  ? ?Lab Results  ?Component Value Date  ? CHOLHDL 3 11/21/2021  ? ?Lab Results  ?Component Value Date  ? HGBA1C 7.1 (H) 11/21/2021  ? ? ?  ?Assessment & Plan:  ? ?Problem List Items Addressed This Visit   ? ?  ? Endocrine  ? Type 2 diabetes mellitus without complication, without long-term current use of insulin ([place]) - Primary  ? Relevant Medications  ? Semaglutide, 1 MG/DOSE, 4 MG/3ML SOPN  ? metFORMIN (GLUCOPHAGE) 1000 MG tablet  ? ? ?Meds ordered this encounter  ?Medications  ? Semaglutide, 1 MG/DOSE, 4 MG/3ML SOPN  ?  Sig: Inject 1 mg as directed once a week.  ?  Dispense:  9 mL  ?  Refill:  1  ?  Please instruct. Needs appropriate pen needles.  ? metFORMIN (GLUCOPHAGE) 1000 MG tablet  ?  Sig: Take 1 tablet (1,000 mg total) by mouth 2 (two) times daily with a meal.  ?  Dispense:  180 tablet  ?  Refill:  3  ? ? ?Follow-up: Return Has fu scheduled in August..  ?Patient was given information on semaglutide to download.  He will continue with metformin 1000 mg twice daily.  He will check with his father to see exactly what type of thyroid cancer he had contracted.  Discussed that GLP-1 agonist have been associated with thyroid C carcinoma in laboratory mice.  Warned him about nausea with high carbohydrate loads.  Urged him to incorporate an exercise program into his daily life.  Follow-up as previously scheduled in August.  He will let me know if there are problems. ? ?Libby Maw, MD ? ?Virtual Visit via Video Note ? ?I connected with Joseph Horton on 01/17/22 at 11:20 AM EDT by a video enabled telemedicine application and verified that I am speaking with the correct person using two identifiers. ? ?Location: ?Patient: alone in his car ?Provider: work ?  ?I discussed the limitations of  evaluation and management by telemedicine and the availability of in person appointments. The patient expressed understanding and agreed to proceed. ? ?History of Present Illness: ? ?  ?Observations/Objective: ? ? ?Assessment and Plan:

## 2022-05-05 ENCOUNTER — Other Ambulatory Visit: Payer: Self-pay | Admitting: Family Medicine

## 2022-05-05 DIAGNOSIS — E119 Type 2 diabetes mellitus without complications: Secondary | ICD-10-CM

## 2022-05-09 ENCOUNTER — Other Ambulatory Visit: Payer: Self-pay | Admitting: Family Medicine

## 2022-05-09 DIAGNOSIS — E119 Type 2 diabetes mellitus without complications: Secondary | ICD-10-CM

## 2022-05-20 ENCOUNTER — Telehealth: Payer: Self-pay | Admitting: Family Medicine

## 2022-05-20 DIAGNOSIS — E7800 Pure hypercholesterolemia, unspecified: Secondary | ICD-10-CM

## 2022-05-20 MED ORDER — EZETIMIBE 10 MG PO TABS: 10.0000 mg | ORAL_TABLET | Freq: Every day | ORAL | 3 refills | Status: AC

## 2022-05-20 NOTE — Telephone Encounter (Signed)
Refilled Ezetimibe no PA needed for this, PA approved for Ozempic patient aware that both prescriptions at pharmacy and ready for pick up today.

## 2022-05-20 NOTE — Telephone Encounter (Signed)
Caller Name: Aramis Weil Call back phone #: [phone]  MEDICATION(S): ezetimibe  Days of Med Remaining: 0, out for a week   Has the patient contacted their pharmacy (YES/NO)? yes What did pharmacy advise? Pharmacy needs a PA before they can fill prescription  Preferred Pharmacy: [facility])  [address] [place], [place] [address] [phone]#[phone] ~~~Please advise patient/caregiver to allow 2-3 business days to process RX refills.

## 2022-05-22 ENCOUNTER — Encounter: Payer: Self-pay | Admitting: Family Medicine

## 2022-05-22 ENCOUNTER — Ambulatory Visit: Payer: BC Managed Care – PPO | Admitting: Family Medicine

## 2022-05-22 VITALS — BP 110/70 | HR 78 | Temp 97.0°F | Ht 64.0 in | Wt 156.4 lb

## 2022-05-22 DIAGNOSIS — Z125 Encounter for screening for malignant neoplasm of prostate: Secondary | ICD-10-CM

## 2022-05-22 DIAGNOSIS — M5431 Sciatica, right side: Secondary | ICD-10-CM | POA: Diagnosis not present

## 2022-05-22 DIAGNOSIS — E119 Type 2 diabetes mellitus without complications: Secondary | ICD-10-CM

## 2022-05-22 DIAGNOSIS — Z23 Encounter for immunization: Secondary | ICD-10-CM | POA: Diagnosis not present

## 2022-05-22 DIAGNOSIS — E7800 Pure hypercholesterolemia, unspecified: Secondary | ICD-10-CM

## 2022-05-22 LAB — URINALYSIS, ROUTINE W REFLEX MICROSCOPIC
Bilirubin Urine: NEGATIVE
Hgb urine dipstick: NEGATIVE
Ketones, ur: NEGATIVE
Leukocytes,Ua: NEGATIVE
Nitrite: NEGATIVE
Specific Gravity, Urine: <=1.005 — AB (ref 1.000–1.030)
Total Protein, Urine: NEGATIVE
Urine Glucose: NEGATIVE
Urobilinogen, UA: 0.2 (ref 0.0–1.0)
pH: 6.5 (ref 5.0–8.0)

## 2022-05-22 LAB — MICROALBUMIN / CREATININE URINE RATIO
Creatinine,U: 87.3 mg/dL
Microalb Creat Ratio: 0.8 mg/g (ref 0.0–30.0)
Microalb, Ur: <0.7 mg/dL (ref 0.0–1.9)

## 2022-05-22 LAB — COMPREHENSIVE METABOLIC PANEL
ALT: 55 U/L — ABNORMAL HIGH (ref 0–53)
AST: 26 U/L (ref 0–37)
Albumin: 4.6 g/dL (ref 3.5–5.2)
Alkaline Phosphatase: 111 U/L (ref 39–117)
BUN: 11 mg/dL (ref 6–23)
CO2: 29 mEq/L (ref 19–32)
Calcium: 9.6 mg/dL (ref 8.4–10.5)
Chloride: 102 mEq/L (ref 96–112)
Creatinine, Ser: 1.08 mg/dL (ref 0.40–1.50)
GFR: 78.92 mL/min (ref >60.00)
Glucose, Bld: 134 mg/dL — ABNORMAL HIGH (ref 70–99)
Potassium: 4.9 mEq/L (ref 3.5–5.1)
Sodium: 137 mEq/L (ref 135–145)
Total Bilirubin: 0.6 mg/dL (ref 0.2–1.2)
Total Protein: 7.4 g/dL (ref 6.0–8.3)

## 2022-05-22 LAB — CBC
HCT: 49.6 % (ref 39.0–52.0)
Hemoglobin: 16.7 g/dL (ref 13.0–17.0)
MCHC: 33.7 g/dL (ref 30.0–36.0)
MCV: 95.7 fl (ref 78.0–100.0)
Platelets: 244 10*3/uL (ref 150.0–400.0)
RBC: 5.19 Mil/uL (ref 4.22–5.81)
RDW: 13.1 % (ref 11.5–15.5)
WBC: 5.8 10*3/uL (ref 4.0–10.5)

## 2022-05-22 LAB — LIPID PANEL
Cholesterol: 119 mg/dL (ref 0–200)
HDL: 44.9 mg/dL (ref >39.00)
LDL Cholesterol: 41 mg/dL (ref 0–99)
NonHDL: 73.86
Total CHOL/HDL Ratio: 3
Triglycerides: 166 mg/dL — ABNORMAL HIGH (ref 0.0–149.0)
VLDL: 33.2 mg/dL (ref 0.0–40.0)

## 2022-05-22 LAB — HEMOGLOBIN A1C: Hgb A1c MFr Bld: 6.8 % — ABNORMAL HIGH (ref 4.6–6.5)

## 2022-05-22 LAB — PSA: PSA: 0.92 ng/mL (ref 0.10–4.00)

## 2022-05-22 MED ORDER — METFORMIN HCL 1000 MG PO TABS: 1000.0000 mg | ORAL_TABLET | Freq: Two times a day (BID) | ORAL | 3 refills | Status: DC

## 2022-05-22 NOTE — Progress Notes (Signed)
Established Patient Office Visit  Subjective   Patient ID: Joseph Horton, male    DOB: [birth year]  Age: 51 y.o. MRN: [id number]  Chief Complaint  Patient presents with   Follow-up    6 month follow up, concerns possible sciatica pain in right leg.     HPI follow-up diabetes and elevated cholesterol with history of coronary artery disease and statin intolerance.  Continues with [place] through cardiology.  Has been able to lose weight with 1 mg of semaglutide weekly.  Self discontinued metformin because he thought it was not effective.  He has been taking a homeopathic medication and asked for a refill.  He has been having a problem with what he believes to be sciatica.  He has pain in his right buttock associated with pain in his right foot.  Denies actual radiation of pain.  There is no bowel or bladder incontinence.  Urine flow is excellent.  Denies problems with stooling including melena or blood in his stool.  Patient has not been exercising.    Review of Systems  Constitutional: Negative.   HENT: Negative.    Eyes:  Negative for blurred vision, discharge and redness.  Respiratory: Negative.    Cardiovascular: Negative.   Gastrointestinal:  Negative for abdominal pain, blood in stool, constipation and melena.  Genitourinary: Negative.  Negative for frequency, hematuria and urgency.  Musculoskeletal: Negative.  Negative for myalgias.  Skin:  Negative for rash.  Neurological:  Negative for tingling, loss of consciousness and weakness.  Endo/Heme/Allergies:  Negative for polydipsia.      Objective:     BP 110/70 (BP Location: Right Arm, Patient Position: Sitting, Cuff Size: Normal)   Pulse 78   Temp (!) 97 F (36.1 C) (Temporal)   Ht '5\' 4"'$  (1.626 m)   Wt 156 lb 6.4 oz (70.9 kg)   SpO2 97%   BMI 26.85 kg/m  Wt Readings from Last 3 Encounters:  05/22/22 156 lb 6.4 oz (70.9 kg)  01/17/22 166 lb (75.3 kg)  11/21/21 168 lb (76.2 kg)      Physical Exam Constitutional:       General: He is not in acute distress.    Appearance: Normal appearance. He is not ill-appearing, toxic-appearing or diaphoretic.  HENT:     Head: Normocephalic and atraumatic.     Right Ear: External ear normal.     Left Ear: External ear normal.     Mouth/Throat:     Mouth: Mucous membranes are moist.     Pharynx: Oropharynx is clear. No oropharyngeal exudate or posterior oropharyngeal erythema.  Eyes:     General: No scleral icterus.       Right eye: No discharge.        Left eye: No discharge.     Extraocular Movements: Extraocular movements intact.     Conjunctiva/sclera: Conjunctivae normal.     Pupils: Pupils are equal, round, and reactive to light.  Cardiovascular:     Rate and Rhythm: Normal rate and regular rhythm.  Pulmonary:     Effort: Pulmonary effort is normal. No respiratory distress.     Breath sounds: Normal breath sounds.  Abdominal:     General: Bowel sounds are normal.     Tenderness: There is no abdominal tenderness. There is no guarding.  Musculoskeletal:     Cervical back: No rigidity or tenderness.     Lumbar back: Normal range of motion. Positive right straight leg raise test and positive left straight leg raise test.  Comments: Negative sciatic notch tenderness to palpation.  Skin:    General: Skin is warm and dry.  Neurological:     Mental Status: He is alert and oriented to person, place, and time.     Motor: No weakness.     Deep Tendon Reflexes:     Reflex Scores:      Patellar reflexes are 2+ on the right side and 2+ on the left side.      Achilles reflexes are 1+ on the right side and 1+ on the left side. Psychiatric:        Mood and Affect: Mood normal.        Behavior: Behavior normal.    Diabetic Foot Exam - Simple   Simple Foot Form Diabetic Foot exam was performed with the following findings: Yes 05/22/2022  9:29 AM  Visual Inspection See comments: Yes Sensation Testing Intact to touch and monofilament testing bilaterally:  Yes Pulse Check Posterior Tibialis and Dorsalis pulse intact bilaterally: Yes Comments Feet are cavus bilaterally.  There were no lesions or ulcerations.      No results found for any visits on 05/22/22.    The ASCVD Risk score (Arnett DK, et al., 2019) failed to calculate for the following reasons:   The patient has a prior MI or stroke diagnosis    Assessment & Plan:   Problem List Items Addressed This Visit       Endocrine   Type 2 diabetes mellitus without complication, without long-term current use of insulin ([facility])   Relevant Medications   metFORMIN (GLUCOPHAGE) 1000 MG tablet   Other Relevant Orders   Comprehensive metabolic panel   Hemoglobin A1c   Urinalysis, Routine w reflex microscopic   Microalbumin / creatinine urine ratio     Other   Screening for prostate cancer   Relevant Orders   PSA   Elevated cholesterol   Relevant Orders   CBC   Comprehensive metabolic panel   Lipid panel   Need for shingles vaccine - Primary   Relevant Orders   Varicella-zoster vaccine IM (Completed)   Other Visit Diagnoses     Sciatica of right side       Relevant Orders   Ambulatory referral to Sports Medicine       Return in about 6 months (around 11/22/2022), or Continue metformin as directed..   Advised exercise for 30 minutes most days of the week.  Restart metformin.  Continue semaglutide. Libby Maw, MD

## 2022-05-28 ENCOUNTER — Ambulatory Visit: Payer: BC Managed Care – PPO | Admitting: Family Medicine

## 2022-06-05 ENCOUNTER — Ambulatory Visit: Payer: BC Managed Care – PPO | Admitting: Family Medicine

## 2022-06-05 ENCOUNTER — Encounter: Payer: Self-pay | Admitting: Family Medicine

## 2022-06-05 ENCOUNTER — Other Ambulatory Visit: Payer: Self-pay | Admitting: Family Medicine

## 2022-06-05 VITALS — BP 110/72 | Ht 64.0 in | Wt 156.0 lb

## 2022-06-05 DIAGNOSIS — M5416 Radiculopathy, lumbar region: Secondary | ICD-10-CM

## 2022-06-05 DIAGNOSIS — E119 Type 2 diabetes mellitus without complications: Secondary | ICD-10-CM

## 2022-06-05 MED ORDER — MELOXICAM 15 MG PO TABS: ORAL_TABLET | ORAL | 3 refills | Status: DC

## 2022-06-05 NOTE — Patient Instructions (Signed)
Good to see you Please try heat  Please try the exercises   Please send me a message in MyChart with any questions or updates.  Please see me back in 4 weeks .   --Dr. Raeford Razor

## 2022-06-05 NOTE — Assessment & Plan Note (Signed)
Acute on chronic in nature.  Having symptoms more present in the morning and more over the right SI joint.  Does have radicular appointment on the right leg. -Counseled on home exercise therapy and supportive care. -Meloxicam. -Could consider further imaging or physical therapy.

## 2022-06-05 NOTE — Progress Notes (Signed)
  Joseph Horton - 51 y.o. male MRN [id number]  Date of birth: 1971-03-07  SUBJECTIVE:  Including CC & ROS.  No chief complaint on file.   Joseph Horton is a 51 y.o. male that is presenting with right lower back pain and right leg pain.  The pain has been present for 3 months.  Denies any specific inciting event.  Notices the pain worse in the morning.  No history of surgery.   Review of Systems See HPI   HISTORY: Past Medical, Surgical, Social, and Family History Reviewed & Updated per EMR.   Pertinent Historical Findings include:  Past Medical History:  Diagnosis Date   History of colon polyps    Hyperlipidemia    Myocardial infarction ([place])    Neuromuscular disorder ([place])    Bell's palsy    Past Surgical History:  Procedure Laterality Date   CARDIAC CATHETERIZATION  05/05/2014   stents placed   COLONOSCOPY     2018     PHYSICAL EXAM:  VS: BP 110/72 (BP Location: Left Arm, Patient Position: Sitting)   Ht '5\' 4"'$  (1.626 m)   Wt 156 lb (70.8 kg)   BMI 26.78 kg/m  Physical Exam Gen: NAD, alert, cooperative with exam, well-appearing MSK:  Neurovascularly intact       ASSESSMENT & PLAN:   Lumbar radiculopathy Acute on chronic in nature.  Having symptoms more present in the morning and more over the right SI joint.  Does have radicular appointment on the right leg. -Counseled on home exercise therapy and supportive care. -Meloxicam. -Could consider further imaging or physical therapy.

## 2022-06-16 NOTE — Telephone Encounter (Signed)
Na

## 2022-07-04 ENCOUNTER — Ambulatory Visit: Payer: BC Managed Care – PPO | Admitting: Family Medicine

## 2022-07-08 ENCOUNTER — Other Ambulatory Visit: Payer: Self-pay | Admitting: Family Medicine

## 2022-07-08 DIAGNOSIS — E119 Type 2 diabetes mellitus without complications: Secondary | ICD-10-CM

## 2022-08-12 ENCOUNTER — Ambulatory Visit: Payer: BC Managed Care – PPO | Admitting: Family Medicine

## 2022-10-06 ENCOUNTER — Encounter: Payer: Self-pay | Admitting: Family Medicine

## 2022-11-04 ENCOUNTER — Other Ambulatory Visit: Payer: Self-pay

## 2022-11-04 DIAGNOSIS — E119 Type 2 diabetes mellitus without complications: Secondary | ICD-10-CM

## 2022-11-04 MED ORDER — OZEMPIC (1 MG/DOSE) 4 MG/3ML ~~LOC~~ SOPN: PEN_INJECTOR | SUBCUTANEOUS | 3 refills | Status: DC

## 2022-11-27 LAB — HM DIABETES EYE EXAM

## 2022-12-31 ENCOUNTER — Other Ambulatory Visit: Payer: Self-pay | Admitting: Family Medicine

## 2022-12-31 DIAGNOSIS — E119 Type 2 diabetes mellitus without complications: Secondary | ICD-10-CM

## 2023-01-29 ENCOUNTER — Other Ambulatory Visit: Payer: Self-pay | Admitting: Family Medicine

## 2023-01-29 DIAGNOSIS — E119 Type 2 diabetes mellitus without complications: Secondary | ICD-10-CM

## 2023-02-02 ENCOUNTER — Encounter: Payer: Self-pay | Admitting: *Deleted

## 2023-02-09 ENCOUNTER — Telehealth: Payer: Self-pay | Admitting: Family Medicine

## 2023-02-09 DIAGNOSIS — E119 Type 2 diabetes mellitus without complications: Secondary | ICD-10-CM

## 2023-02-09 MED ORDER — OZEMPIC (1 MG/DOSE) 4 MG/3ML ~~LOC~~ SOPN
PEN_INJECTOR | SUBCUTANEOUS | 3 refills | Status: DC
Start: 2023-02-09 — End: 2023-09-03

## 2023-02-09 NOTE — Telephone Encounter (Signed)
Prescription Request  02/09/2023  LOV: 05/22/2022  What is the name of the medication or equipment? Semaglutide, 1 MG/DOSE, (OZEMPIC, 1 MG/DOSE,) 4 MG/3ML SOPN [[id number]]   Have you contacted your pharmacy to request a refill? Yes, insurance is calling, he took his last dose on 4/16, he will need to take one tomorrow. He is needing a 3mon supply.  Which pharmacy would you like this sent to?    [id number] - [place], [place] - [address] [place] [address] Phone: [phone] Fax: [phone]    Patient notified that their request is being sent to the clinical staff for review and that they should receive a response within 2 business days.   Please advise at

## 2023-02-13 NOTE — Telephone Encounter (Signed)
Joseph Horton [phone]   Pt called and expressed that He wants this med sent in for 3 months for insurance purposes. He stated that the pharmacy sent a fax a few days ago.  Pt wants this med sent to [facility] [address] in [place] not [facility] Semaglutide, 1 MG/DOSE, (OZEMPIC, 1 MG/DOSE,) 4 MG/3ML SOPN [[id number]]

## 2023-02-13 NOTE — Telephone Encounter (Signed)
Patient notified VIA phone that RX is at [facility] for a 90 day supply and he can call [facility] to have them transfer the prescription.  He will call them. Dm/cma

## 2023-02-17 ENCOUNTER — Encounter: Payer: Self-pay | Admitting: Family Medicine

## 2023-02-25 ENCOUNTER — Other Ambulatory Visit: Payer: Self-pay | Admitting: Family Medicine

## 2023-02-25 DIAGNOSIS — E119 Type 2 diabetes mellitus without complications: Secondary | ICD-10-CM

## 2023-04-07 ENCOUNTER — Other Ambulatory Visit: Payer: Self-pay | Admitting: Family Medicine

## 2023-04-07 DIAGNOSIS — E119 Type 2 diabetes mellitus without complications: Secondary | ICD-10-CM

## 2023-05-06 ENCOUNTER — Other Ambulatory Visit: Payer: Self-pay | Admitting: Family Medicine

## 2023-05-06 DIAGNOSIS — E119 Type 2 diabetes mellitus without complications: Secondary | ICD-10-CM

## 2023-05-11 ENCOUNTER — Other Ambulatory Visit: Payer: Self-pay | Admitting: Family Medicine

## 2023-05-11 DIAGNOSIS — E119 Type 2 diabetes mellitus without complications: Secondary | ICD-10-CM

## 2023-07-30 ENCOUNTER — Encounter: Payer: 59 | Admitting: Family Medicine

## 2023-08-26 ENCOUNTER — Encounter: Payer: Self-pay | Admitting: Family Medicine

## 2023-08-26 NOTE — Telephone Encounter (Addendum)
PA for Ozempic submitted through cover my meds.  Was approved.   [id number];Review Type:Prior Auth;Coverage Start Date:07/27/2023;Coverage End Date:08/25/2024;. Authorization Expiration Date: August 25, 2024. Spoke to patient and he had his RX's transferred to [facility] on [address]  [place].   He ill call them and let them know.  Dm/cma

## 2023-09-03 ENCOUNTER — Ambulatory Visit: Payer: 59 | Admitting: Family Medicine

## 2023-09-03 ENCOUNTER — Encounter: Payer: Self-pay | Admitting: Family Medicine

## 2023-09-03 VITALS — BP 116/74 | HR 84 | Temp 97.7°F | Ht 64.0 in | Wt 159.6 lb

## 2023-09-03 DIAGNOSIS — E1165 Type 2 diabetes mellitus with hyperglycemia: Secondary | ICD-10-CM

## 2023-09-03 DIAGNOSIS — Z0000 Encounter for general adult medical examination without abnormal findings: Secondary | ICD-10-CM

## 2023-09-03 DIAGNOSIS — I10 Essential (primary) hypertension: Secondary | ICD-10-CM | POA: Diagnosis not present

## 2023-09-03 DIAGNOSIS — Z7984 Long term (current) use of oral hypoglycemic drugs: Secondary | ICD-10-CM

## 2023-09-03 DIAGNOSIS — E7800 Pure hypercholesterolemia, unspecified: Secondary | ICD-10-CM

## 2023-09-03 DIAGNOSIS — Z125 Encounter for screening for malignant neoplasm of prostate: Secondary | ICD-10-CM

## 2023-09-03 LAB — URINALYSIS, ROUTINE W REFLEX MICROSCOPIC
Bilirubin Urine: NEGATIVE
Hgb urine dipstick: NEGATIVE
Ketones, ur: NEGATIVE
Leukocytes,Ua: NEGATIVE
Nitrite: NEGATIVE
RBC / HPF: NONE SEEN (ref 0–?)
Specific Gravity, Urine: 1.01 (ref 1.000–1.030)
Total Protein, Urine: NEGATIVE
Urine Glucose: NEGATIVE
Urobilinogen, UA: 0.2 (ref 0.0–1.0)
WBC, UA: NONE SEEN (ref 0–?)
pH: 6 (ref 5.0–8.0)

## 2023-09-03 LAB — COMPREHENSIVE METABOLIC PANEL
ALT: 21 U/L (ref 0–53)
AST: 17 U/L (ref 0–37)
Albumin: 4.4 g/dL (ref 3.5–5.2)
Alkaline Phosphatase: 115 U/L (ref 39–117)
BUN: 11 mg/dL (ref 6–23)
CO2: 26 meq/L (ref 19–32)
Calcium: 9.2 mg/dL (ref 8.4–10.5)
Chloride: 104 meq/L (ref 96–112)
Creatinine, Ser: 1.08 mg/dL (ref 0.40–1.50)
GFR: 78.21 mL/min (ref >60.00)
Glucose, Bld: 115 mg/dL — ABNORMAL HIGH (ref 70–99)
Potassium: 4.3 meq/L (ref 3.5–5.1)
Sodium: 137 meq/L (ref 135–145)
Total Bilirubin: 0.6 mg/dL (ref 0.2–1.2)
Total Protein: 6.8 g/dL (ref 6.0–8.3)

## 2023-09-03 LAB — CBC WITH DIFFERENTIAL/PLATELET
Basophils Absolute: 0.1 10*3/uL (ref 0.0–0.1)
Basophils Relative: 1 % (ref 0.0–3.0)
Eosinophils Absolute: 0.3 10*3/uL (ref 0.0–0.7)
Eosinophils Relative: 5.7 % — ABNORMAL HIGH (ref 0.0–5.0)
HCT: 49.8 % (ref 39.0–52.0)
Hemoglobin: 17 g/dL (ref 13.0–17.0)
Lymphocytes Relative: 31.6 % (ref 12.0–46.0)
Lymphs Abs: 1.8 10*3/uL (ref 0.7–4.0)
MCHC: 34 g/dL (ref 30.0–36.0)
MCV: 98.5 fL (ref 78.0–100.0)
Monocytes Absolute: 0.4 10*3/uL (ref 0.1–1.0)
Monocytes Relative: 6.7 % (ref 3.0–12.0)
Neutro Abs: 3.2 10*3/uL (ref 1.4–7.7)
Neutrophils Relative %: 55 % (ref 43.0–77.0)
Platelets: 274 10*3/uL (ref 150.0–400.0)
RBC: 5.06 Mil/uL (ref 4.22–5.81)
RDW: 12.9 % (ref 11.5–15.5)
WBC: 5.8 10*3/uL (ref 4.0–10.5)

## 2023-09-03 LAB — MICROALBUMIN / CREATININE URINE RATIO
Creatinine,U: 109.8 mg/dL
Microalb Creat Ratio: 0.6 mg/g (ref 0.0–30.0)
Microalb, Ur: <0.7 mg/dL (ref 0.0–1.9)

## 2023-09-03 LAB — PSA: PSA: 1.16 ng/mL (ref 0.10–4.00)

## 2023-09-03 MED ORDER — SEMAGLUTIDE(0.25 OR 0.5MG/DOS) 2 MG/1.5ML ~~LOC~~ SOPN: 0.5000 mg | PEN_INJECTOR | SUBCUTANEOUS | 5 refills | Status: DC

## 2023-09-03 MED ORDER — METFORMIN HCL ER 500 MG PO TB24: ORAL_TABLET | ORAL | 1 refills | Status: DC

## 2023-09-03 MED ORDER — REPATHA SURECLICK 140 MG/ML ~~LOC~~ SOAJ
280.0000 mg | SUBCUTANEOUS | 12 refills | Status: AC
Start: 2023-09-03 — End: ?

## 2023-09-03 NOTE — Progress Notes (Addendum)
Established Patient Office Visit   Subjective:  Patient ID: Joseph Horton, male    DOB: [birth year]  Age: 52 y.o. MRN: [id number]  Chief Complaint  Patient presents with   Annual Exam    CPE. Pt is fasting.    Diabetes    Diabetes Pertinent negatives for diabetes include no blurred vision, no polydipsia and no weakness.   Encounter Diagnoses  Name Primary?   Healthcare maintenance Yes   Screening for prostate cancer    Uncontrolled type 2 diabetes mellitus with hyperglycemia (HCC)    Elevated cholesterol    Essential hypertension    Here for physical and follow-up of above.  Diabetes has not been well-controlled lately.  Fasting sugars are running in the +140 range.  He has only been able to tolerate the 1 mg dosage of semaglutide every other week.  He has only been able to tolerate the metformin 500 mg in the morning and at thousand in the afternoon.  He is not exercising much.  Cholesterol has been well-controlled with the Repatha and Zetia.  Blood pressure well-controlled with the lisinopril.  He is up-to-date on health maintenance.   Review of Systems  Constitutional: Negative.   HENT: Negative.    Eyes:  Negative for blurred vision, discharge and redness.  Respiratory: Negative.    Cardiovascular: Negative.   Gastrointestinal:  Negative for abdominal pain, blood in stool, constipation and melena.  Genitourinary: Negative.  Negative for dysuria, frequency, hematuria and urgency.  Musculoskeletal: Negative.  Negative for myalgias.  Skin:  Negative for rash.  Neurological:  Negative for tingling, loss of consciousness and weakness.  Endo/Heme/Allergies:  Negative for polydipsia.      09/03/2023    8:32 AM 05/22/2022    8:45 AM 01/17/2022   11:36 AM  Depression screen PHQ 2/9  Decreased Interest 0 0 0  Down, Depressed, Hopeless 0 0 0  PHQ - 2 Score 0 0 0  Altered sleeping 0    Tired, decreased energy 0    Change in appetite 0    Feeling bad or failure about yourself   0    Trouble concentrating 0    Moving slowly or fidgety/restless 0    Suicidal thoughts 0    PHQ-9 Score 0    Difficult doing work/chores Not difficult at all         Current Outpatient Medications:    aspirin 81 MG tablet, Take 1 tablet by mouth daily., Disp: , Rfl:    Continuous Blood Gluc Receiver (DEXCOM G7 RECEIVER) DEVI, 1 Units by Does not apply route daily., Disp: 1 each, Rfl: 5   Continuous Blood Gluc Sensor (DEXCOM G7 SENSOR) MISC, CHANGE SENSOR AFTER NO MORE THAN 10 DAYS, Disp: 9 each, Rfl: 3   Continuous Blood Gluc Transmit (DEXCOM G6 TRANSMITTER) MISC, USE TO TEST BLOOD SUGAR, Disp: 12 each, Rfl: 3   ezetimibe (ZETIA) 10 MG tablet, Take 1 tablet (10 mg total) by mouth daily., Disp: 30 tablet, Rfl: 3   lisinopril (PRINIVIL,ZESTRIL) 10 MG tablet, Take 10 mg by mouth daily., Disp: , Rfl:    metFORMIN (GLUCOPHAGE-XR) 500 MG 24 hr tablet, Take 1 tablet with morning meal and then 2 tablets with evening meal, Disp: 270 tablet, Rfl: 1   metoprolol succinate (TOPROL-XL) 25 MG 24 hr tablet, Take 1 tablet by mouth daily., Disp: , Rfl:    nitroGLYCERIN (NITROSTAT) 0.4 MG SL tablet, Place 1 tablet under the tongue every 5 minutes as needed for up to 30  days for chest pain., Disp: , Rfl:    Omega-3 1000 MG CAPS, Take 1 capsule by mouth daily., Disp: , Rfl:    Semaglutide,0.25 or 0.5MG /DOS, 2 MG/1.5ML SOPN, Inject 0.5 mg into the skin once a week., Disp: 1.5 mL, Rfl: 5   Evolocumab (REPATHA SURECLICK) 140 MG/ML SOAJ, Inject 280 mg into the skin every 14 (fourteen) days. INJECT 280 MG SUBCUTANEOUSLY EVERY 14 DAYS APPOINTMENT REQUIRED FOR FUTURE REFILLS, Disp: 2 mL, Rfl: 12   meloxicam (MOBIC) 15 MG tablet, One tab PO qAM with breakfast for 2 weeks, then daily prn pain. (Patient not taking: Reported on 09/03/2023), Disp: 30 tablet, Rfl: 3   Objective:     BP 116/74   Pulse 84   Temp 97.7 F (36.5 C)   Ht 5\' 4"  (1.626 m)   Wt 159 lb 9.6 oz (72.4 kg)   SpO2 96%   BMI 27.40 kg/m  BP  Readings from Last 3 Encounters:  09/03/23 116/74  06/05/22 110/72  05/22/22 110/70   Wt Readings from Last 3 Encounters:  09/03/23 159 lb 9.6 oz (72.4 kg)  06/05/22 156 lb (70.8 kg)  05/22/22 156 lb 6.4 oz (70.9 kg)      Physical Exam Constitutional:      General: He is not in acute distress.    Appearance: Normal appearance. He is not ill-appearing, toxic-appearing or diaphoretic.  HENT:     Head: Normocephalic and atraumatic.     Right Ear: External ear normal.     Left Ear: External ear normal.     Mouth/Throat:     Mouth: Mucous membranes are moist.     Pharynx: Oropharynx is clear. No oropharyngeal exudate or posterior oropharyngeal erythema.  Eyes:     General: No scleral icterus.       Right eye: No discharge.        Left eye: No discharge.     Extraocular Movements: Extraocular movements intact.     Conjunctiva/sclera: Conjunctivae normal.     Pupils: Pupils are equal, round, and reactive to light.  Cardiovascular:     Rate and Rhythm: Normal rate and regular rhythm.     Pulses:          Dorsalis pedis pulses are 2+ on the right side and 2+ on the left side.       Posterior tibial pulses are 2+ on the right side and 2+ on the left side.  Pulmonary:     Effort: Pulmonary effort is normal. No respiratory distress.     Breath sounds: Normal breath sounds.  Abdominal:     General: Bowel sounds are normal.     Tenderness: There is no abdominal tenderness. There is no guarding.  Musculoskeletal:     Cervical back: No rigidity or tenderness.  Skin:    General: Skin is warm and dry.  Neurological:     Mental Status: He is alert and oriented to person, place, and time.  Psychiatric:        Mood and Affect: Mood normal.        Behavior: Behavior normal.    Diabetic Foot Exam - Simple   Simple Foot Form Diabetic Foot exam was performed with the following findings: Yes 09/03/2023  8:35 AM  Visual Inspection See comments: Yes Sensation Testing Intact to touch and  monofilament testing bilaterally: Yes Pulse Check Posterior Tibialis and Dorsalis pulse intact bilaterally: Yes Comments Feet are cavus bilaterally without lesions or rashes  No results found for any visits on 09/03/23.    The ASCVD Risk score (Arnett DK, et al., 2019) failed to calculate for the following reasons:   The patient has a prior MI or stroke diagnosis    Assessment & Plan:   Healthcare maintenance  Screening for prostate cancer -     PSA  Uncontrolled type 2 diabetes mellitus with hyperglycemia (HCC) -     Comprehensive metabolic panel -     Urinalysis, Routine w reflex microscopic -     Semaglutide(0.25 or 0.5MG /DOS); Inject 0.5 mg into the skin once a week.  Dispense: 1.5 mL; Refill: 5 -     metFORMIN HCl ER; Take 1 tablet with morning meal and then 2 tablets with evening meal  Dispense: 270 tablet; Refill: 1 -     Microalbumin / creatinine urine ratio  Elevated cholesterol -     Repatha SureClick; Inject 280 mg into the skin every 14 (fourteen) days. INJECT 280 MG SUBCUTANEOUSLY EVERY 14 DAYS APPOINTMENT REQUIRED FOR FUTURE REFILLS  Dispense: 2 mL; Refill: 12 -     Comprehensive metabolic panel  Essential hypertension -     CBC with Differential/Platelet -     Comprehensive metabolic panel -     Urinalysis, Routine w reflex microscopic    No follow-ups on file.  Changed metformin to XL dosage.  He will take one 500 mg tablet with breakfast and 2 with his evening meal.  Decrease semaglutide to 0.5 mg weekly in order that he that he may take it weekly.  Stressed the importance of exercise with his history of elevated cholesterol with coronary artery disease and diabetes.  Information was given on health maintenance and disease prevention.  He was also given information on exercising for health  Mliss Sax, MD

## 2023-09-08 ENCOUNTER — Encounter: Payer: Self-pay | Admitting: Family Medicine

## 2023-09-11 NOTE — Telephone Encounter (Signed)
Patient scheduled with Lauren on 09/14/23.  Dm/cma

## 2023-09-12 ENCOUNTER — Other Ambulatory Visit: Payer: Self-pay | Admitting: Family Medicine

## 2023-09-12 DIAGNOSIS — E7800 Pure hypercholesterolemia, unspecified: Secondary | ICD-10-CM

## 2023-09-14 ENCOUNTER — Ambulatory Visit: Payer: 59 | Admitting: Nurse Practitioner

## 2023-09-14 ENCOUNTER — Encounter: Payer: Self-pay | Admitting: Nurse Practitioner

## 2023-09-14 VITALS — BP 120/72 | HR 85 | Temp 97.8°F | Ht 64.0 in | Wt 155.6 lb

## 2023-09-14 DIAGNOSIS — E119 Type 2 diabetes mellitus without complications: Secondary | ICD-10-CM | POA: Diagnosis not present

## 2023-09-14 DIAGNOSIS — Z7984 Long term (current) use of oral hypoglycemic drugs: Secondary | ICD-10-CM | POA: Diagnosis not present

## 2023-09-14 DIAGNOSIS — G510 Bell's palsy: Secondary | ICD-10-CM | POA: Insufficient documentation

## 2023-09-14 NOTE — Assessment & Plan Note (Signed)
He reports blood glucose levels up to 400, likely secondary to recent prednisone use. We will continue home glucose monitoring and check A1C. He should notify his provider if blood glucose remains above 200 after another week. Check A1c today.

## 2023-09-14 NOTE — Progress Notes (Signed)
Established Patient Office Visit  Subjective   Patient ID: Joseph Horton, male    DOB: Apr 13, 1971  Age: 52 y.o. MRN: [id number]  Chief Complaint  Patient presents with   Hospitalization Follow-up    Went to ED with Bells Palsy    HPI  Discussed the use of AI scribe software for clinical note transcription with the patient, who gave verbal consent to proceed.  History of Present Illness   The patient, with a history of diabetes and recurrent Bell's palsy, presents with a recent onset of right-sided facial droop. He noticed the symptoms on Monday 11/18 and sought emergency care on Tuesday 09/08/23. He denies visual disturbances and weakness in the right arm and leg. He reports difficulty closing the right eye, especially during sleep, and uses ointment to keep it moist. He also reports difficulty eating and drinking due to lack of control over the right side of his face, but denies numbness. This is the fourth episode of Bell's palsy he has experienced, with the last episode occurring in 2005. He was treated in the emergency department with a course of prednisone and valacyclovir, which he has completed. He reports a slight improvement in symptoms since starting the medications. He also reports a recent history of neck pain on the same side as the facial droop, which has resolved since starting the prednisone. He denies recent illness, outdoor activities, or tick bites. He has been monitoring his blood glucose levels at home, which have been elevated since starting the prednisone, with readings up to 400. He is currently taking metformin for diabetes management.        ROS See pertinent positives and negatives per HPI.    Objective:     BP 120/72   Pulse 85   Temp 97.8 F (36.6 C)   Ht 5\' 4"  (1.626 m)   Wt 155 lb 9.6 oz (70.6 kg)   SpO2 96%   BMI 26.71 kg/m    Physical Exam Vitals and nursing note reviewed.  Constitutional:      Appearance: Normal appearance.  HENT:      Head: Normocephalic.  Eyes:     Conjunctiva/sclera: Conjunctivae normal.  Cardiovascular:     Rate and Rhythm: Normal rate and regular rhythm.     Pulses: Normal pulses.     Heart sounds: Normal heart sounds.  Pulmonary:     Effort: Pulmonary effort is normal.     Breath sounds: Normal breath sounds.  Musculoskeletal:     Cervical back: Normal range of motion.  Skin:    General: Skin is warm.  Neurological:     General: No focal deficit present.     Mental Status: He is alert and oriented to person, place, and time.     Cranial Nerves: Cranial nerve deficit present.     Motor: No weakness.     Gait: Gait normal.     Comments: Facial droop right side, trouble closing right eyelid, smooth forehead wrinkles on right side  Psychiatric:        Mood and Affect: Mood normal.        Behavior: Behavior normal.        Thought Content: Thought content normal.        Judgment: Judgment normal.      Assessment & Plan:   Problem List Items Addressed This Visit       Endocrine   Type 2 diabetes mellitus without complication, without long-term current use of insulin (HCC)  He reports blood glucose levels up to 400, likely secondary to recent prednisone use. We will continue home glucose monitoring and check A1C. He should notify his provider if blood glucose remains above 200 after another week. Check A1c today.       Relevant Orders   Hemoglobin A1c   Ambulatory referral to Endocrinology     Nervous and Auditory   Bell's palsy - Primary    He is experiencing his fourth episode of Bell's palsy, presenting with right-sided facial weakness. There are no visual disturbances or limb weakness. He has completed a course of prednisone and valacyclovir, showing some improvement but still struggles with closing his right eye and controlling the right side of his face. There is no recent tick exposure or symptoms suggestive of Lyme disease. We will continue artificial tears or ointment for eye  dryness and consider taping the eye shut at night to maintain moisture. A referral to neurology for further evaluation is necessary given the recurrent episodes, and we will check for Lyme disease, CMP, and CBC.      Relevant Orders   CBC with Differential/Platelet   Comprehensive metabolic panel   B. burgdorfi antibodies   Ambulatory referral to Neurology    Return if symptoms worsen or fail to improve.    Gerre Scull, NP

## 2023-09-14 NOTE — Patient Instructions (Signed)
It was great to see you!  We are checking your labs today and will let you know the results via mychart/phone.   I have placed a referral to neurology and endocrine, they will call to schedule.   Let us know if your blood sugar keeps going above 200 after another week.   Let's follow-up if symptoms worsen or any concerns.   Take care,  Rodman Pickle, NP

## 2023-09-14 NOTE — Assessment & Plan Note (Signed)
He is experiencing his fourth episode of Bell's palsy, presenting with right-sided facial weakness. There are no visual disturbances or limb weakness. He has completed a course of prednisone and valacyclovir, showing some improvement but still struggles with closing his right eye and controlling the right side of his face. There is no recent tick exposure or symptoms suggestive of Lyme disease. We will continue artificial tears or ointment for eye dryness and consider taping the eye shut at night to maintain moisture. A referral to neurology for further evaluation is necessary given the recurrent episodes, and we will check for Lyme disease, CMP, and CBC.

## 2023-09-15 LAB — COMPREHENSIVE METABOLIC PANEL
ALT: 31 U/L (ref 0–53)
AST: 22 U/L (ref 0–37)
Albumin: 4.2 g/dL (ref 3.5–5.2)
Alkaline Phosphatase: 88 U/L (ref 39–117)
BUN: 23 mg/dL (ref 6–23)
CO2: 22 meq/L (ref 19–32)
Calcium: 8.8 mg/dL (ref 8.4–10.5)
Chloride: 100 meq/L (ref 96–112)
Creatinine, Ser: 1.11 mg/dL (ref 0.40–1.50)
GFR: 75.66 mL/min (ref >60.00)
Glucose, Bld: 312 mg/dL — ABNORMAL HIGH (ref 70–99)
Potassium: 4.9 meq/L (ref 3.5–5.1)
Sodium: 131 meq/L — ABNORMAL LOW (ref 135–145)
Total Bilirubin: 0.7 mg/dL (ref 0.2–1.2)
Total Protein: 6.7 g/dL (ref 6.0–8.3)

## 2023-09-15 LAB — CBC WITH DIFFERENTIAL/PLATELET
Basophils Absolute: 0 10*3/uL (ref 0.0–0.1)
Basophils Relative: 0.5 % (ref 0.0–3.0)
Eosinophils Absolute: 0 10*3/uL (ref 0.0–0.7)
Eosinophils Relative: 0 % (ref 0.0–5.0)
HCT: 47.9 % (ref 39.0–52.0)
Hemoglobin: 16.6 g/dL (ref 13.0–17.0)
Lymphocytes Relative: 10.5 % — ABNORMAL LOW (ref 12.0–46.0)
Lymphs Abs: 0.9 10*3/uL (ref 0.7–4.0)
MCHC: 34.6 g/dL (ref 30.0–36.0)
MCV: 97.5 fL (ref 78.0–100.0)
Monocytes Absolute: 0.2 10*3/uL (ref 0.1–1.0)
Monocytes Relative: 2.6 % — ABNORMAL LOW (ref 3.0–12.0)
Neutro Abs: 7.4 10*3/uL (ref 1.4–7.7)
Neutrophils Relative %: 86.4 % — ABNORMAL HIGH (ref 43.0–77.0)
Platelets: 262 10*3/uL (ref 150.0–400.0)
RBC: 4.92 Mil/uL (ref 4.22–5.81)
RDW: 12.7 % (ref 11.5–15.5)
WBC: 8.6 10*3/uL (ref 4.0–10.5)

## 2023-09-15 LAB — HEMOGLOBIN A1C: Hgb A1c MFr Bld: 8.3 % — ABNORMAL HIGH (ref 4.6–6.5)

## 2023-09-15 LAB — B. BURGDORFI ANTIBODIES: B burgdorferi Ab IgG+IgM: <0.9 {index}

## 2023-09-16 ENCOUNTER — Encounter: Payer: Self-pay | Admitting: Neurology

## 2023-09-16 MED ORDER — SEMAGLUTIDE (1 MG/DOSE) 4 MG/3ML ~~LOC~~ SOPN: 1.0000 mg | PEN_INJECTOR | SUBCUTANEOUS | 0 refills | Status: DC

## 2023-09-16 NOTE — Addendum Note (Signed)
Addended by: Rodman Pickle A on: 09/16/2023 01:08 PM   Modules accepted: Orders

## 2023-09-24 ENCOUNTER — Encounter: Payer: Self-pay | Admitting: Nurse Practitioner

## 2023-10-09 ENCOUNTER — Encounter: Payer: Self-pay | Admitting: Family Medicine

## 2023-10-15 NOTE — Telephone Encounter (Signed)
Results abstracted and care team updated.   Thank you!

## 2023-11-03 ENCOUNTER — Encounter: Payer: Self-pay | Admitting: Neurology

## 2023-11-03 ENCOUNTER — Other Ambulatory Visit: Payer: 59

## 2023-11-03 ENCOUNTER — Ambulatory Visit: Payer: 59 | Admitting: Neurology

## 2023-11-03 VITALS — BP 105/71 | HR 85 | Ht 64.0 in | Wt 160.0 lb

## 2023-11-03 DIAGNOSIS — G629 Polyneuropathy, unspecified: Secondary | ICD-10-CM

## 2023-11-03 DIAGNOSIS — G519 Disorder of facial nerve, unspecified: Secondary | ICD-10-CM | POA: Diagnosis not present

## 2023-11-03 NOTE — Progress Notes (Signed)
 [facility] Clinic Note - Initial Visit   Date: 11/03/2023   Joseph Horton MRN: [id number] DOB: 1971/05/01   Dear Tinnie Harada, NP:  Thank you for your kind referral of Joseph Horton for consultation of recurrent Bell's palsy. Although his history is well known to you, please allow us  to reiterate it for the purpose of our medical record. The patient was accompanied to the clinic by self.    Joseph Horton is a 53 y.o. right-handed male with diabetes mellitus, hypertension, and hyperlipidemia presenting for evaluation of recurrent Bell's palsy.   IMPRESSION/PLAN: Recurrent Bell's palsy with history of right side involvement x 3 and once on the left over the past 30 years.  Fortunately, he has relatively quick recovery and minimal residual deficits.  With his repeated symptoms, I recommend evaluating with MRI brain wwo contrast and plan for CSF testing.  I will also check ESR, CRP, TSH, vitamin B12, SPEP with IFE, copper, ACE.  He has history of chickenpox in 1985 and it is possible that he may have reactivation of dormant viral infection.  Check CSF cell count and diff, protein, glucose, IgG index, oligoclonal bands, myelin basic protein, cytology, ACE, CSF lyme PCR, CSF VZV PCR, CSF HSV PCR.   Further recommendations pending results.  ------------------------------------------------------------- History of present illness: He reports having history of recurrent Bell's palsy affecting the right side 3 times and once on the left side.  Symptoms quickly resolve within three weeks.  The last time this occurred was in November 2024 on the right side, prior to this it was ~2004, 1998, early 1990s.  No associated risk rash.  He was given prednisone and antiviral therapy. No specific triggers, preceding illness, or stress. He is diabetic with HbA1c 8.3 and will be seeing endocrinology.  No family history of Bell's palsy.  He moved from [place] in 1998.  He had chickenpox in  1987.  He lives at home with fiance.  Works as a psychologist, occupational.  Nonsmoker.  Rare alcohol use.   Out-side paper records, electronic medical record, and images have been reviewed where available and summarized as:  Lab Results  Component Value Date   HGBA1C 8.3 (H) 09/14/2023    Past Medical History:  Diagnosis Date   History of colon polyps    Hyperlipidemia    Myocardial infarction [facility])    Neuromuscular disorder (HCC)    Bell's palsy    Past Surgical History:  Procedure Laterality Date   CARDIAC CATHETERIZATION  05/05/2014   stents placed   COLONOSCOPY     2018     Medications:  Outpatient Encounter Medications as of 11/03/2023  Medication Sig   aspirin 81 MG tablet Take 1 tablet by mouth daily.   b complex vitamins capsule Take 1 capsule by mouth daily.   Continuous Blood Gluc Receiver (DEXCOM G7 RECEIVER) DEVI 1 Units by Does not apply route daily.   Continuous Blood Gluc Sensor (DEXCOM G7 SENSOR) MISC CHANGE SENSOR AFTER NO MORE THAN 10 DAYS   Evolocumab  (REPATHA  SURECLICK) 140 MG/ML SOAJ Inject 280 mg into the skin every 14 (fourteen) days. INJECT 280 MG SUBCUTANEOUSLY EVERY 14 DAYS APPOINTMENT REQUIRED FOR FUTURE REFILLS   ezetimibe  (ZETIA ) 10 MG tablet Take 1 tablet (10 mg total) by mouth daily.   lisinopril (PRINIVIL,ZESTRIL) 10 MG tablet Take 10 mg by mouth daily.   metFORMIN  (GLUCOPHAGE -XR) 500 MG 24 hr tablet Take 1 tablet with morning meal and then 2 tablets with evening meal   metoprolol succinate (  TOPROL-XL) 25 MG 24 hr tablet Take 1 tablet by mouth daily.   nitroGLYCERIN (NITROSTAT) 0.4 MG SL tablet Place 1 tablet under the tongue every 5 minutes as needed for up to 30 days for chest pain.   Omega-3 1000 MG CAPS Take 1 capsule by mouth daily.   Semaglutide , 1 MG/DOSE, 4 MG/3ML SOPN Inject 1 mg as directed once a week.   [DISCONTINUED] Continuous Blood Gluc Transmit (DEXCOM G6 TRANSMITTER) MISC USE TO TEST BLOOD SUGAR   [DISCONTINUED] meloxicam  (MOBIC ) 15 MG tablet  One tab PO qAM with breakfast for 2 weeks, then daily prn pain. (Patient not taking: Reported on 11/03/2023)   No facility-administered encounter medications on file as of 11/03/2023.    Allergies:  Allergies  Allergen Reactions   Crab [Shellfish Allergy]     Blue crab   Rosuvastatin Other (See Comments)    myalgia myalgia    Statins Other (See Comments)    Family History: Family History  Problem Relation Age of Onset   Asthma Mother    Hyperlipidemia Mother    Colon polyps Mother    Cancer Father        Thyroid cancer   Heart disease Father    Esophageal cancer Neg Hx    Colon cancer Neg Hx    Rectal cancer Neg Hx    Stomach cancer Neg Hx     Social History: Social History   Tobacco Use   Smoking status: Never   Smokeless tobacco: Never  Vaping Use   Vaping status: Never Used  Substance Use Topics   Alcohol use: Yes    Comment: occ 2 glasses of wine   Drug use: Never   Social History   Social History Narrative   Are you right handed or left handed? Right Handed    Are you currently employed ? 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 Finance   What type of home do you live in: 1 story or 2 story? Lives in a one story home        Vital Signs:  BP 105/71   Pulse 85   Ht 5' 4 (1.626 m)   Wt 160 lb (72.6 kg)   SpO2 97%   BMI 27.46 kg/m    Neurological Exam: MENTAL STATUS including orientation to time, place, person, recent and remote memory, attention span and concentration, language, and fund of knowledge is normal.  Speech is not dysarthric.  CRANIAL NERVES: II:  No visual field defects.     III-IV-VI: Pupils equal round and reactive to light.  Normal conjugate, extra-ocular eye movements in all directions of gaze.  No nystagmus.  Mild right ptosis.   V:  Normal facial sensation.    VII:  Mild right facial asymmetry with flattening of the nasolabial fold.  Frontalis, orbicularis oculi is  5/5.  There is trace weakness with buccinator on the right.    VIII:  Normal hearing and vestibular function.   IX-X:  Normal palatal movement.   XI:  Normal shoulder shrug and head rotation.   XII:  Normal tongue strength and range of motion, no deviation or fasciculation.  MOTOR: Motor strength is 5/5 throughout.  No atrophy, fasciculations or abnormal movements.  No pronator drift.   MSRs:  Right        Left brachioradialis 2+  2+  biceps 2+  2+  triceps 2+  2+  patellar 2+  2+  ankle jerk 2+  2+  Hoffman no  no  plantar response down  down   SENSORY:  Normal and symmetric perception of light touch, pinprick, vibration, and temperature.   COORDINATION/GAIT: Normal finger-to- nose-finger.  Intact rapid alternating movements bilaterally.  Able to rise from a chair without using arms.  Gait narrow based and stable. Tandem and stressed gait intact.     Thank you for allowing me to participate in patient's care.  If I can answer any additional questions, I would be pleased to do so.    Sincerely,    Roscoe Witts K. Tobie, DO

## 2023-11-06 LAB — VITAMIN B12: Vitamin B-12: 977 pg/mL (ref 200–1100)

## 2023-11-06 LAB — C-REACTIVE PROTEIN: CRP: <3 mg/L

## 2023-11-06 LAB — IMMUNOFIXATION ELECTROPHORESIS
IgG (Immunoglobin G), Serum: 939 mg/dL (ref 600–1640)
IgM, Serum: 68 mg/dL (ref 50–300)
Immunoglobulin A: 167 mg/dL (ref 47–310)

## 2023-11-06 LAB — PROTEIN ELECTROPHORESIS, SERUM
Albumin ELP: 4.4 g/dL (ref 3.8–4.8)
Alpha 1: 0.3 g/dL (ref 0.2–0.3)
Alpha 2: 0.7 g/dL (ref 0.5–0.9)
Beta 2: 0.4 g/dL (ref 0.2–0.5)
Beta Globulin: 0.5 g/dL (ref 0.4–0.6)
Gamma Globulin: 0.9 g/dL (ref 0.8–1.7)
Total Protein: 7 g/dL (ref 6.1–8.1)

## 2023-11-06 LAB — SEDIMENTATION RATE: Sed Rate: 6 mm/h (ref 0–20)

## 2023-11-06 LAB — ANGIOTENSIN CONVERTING ENZYME: Angiotensin-Converting Enzyme: 7 U/L — ABNORMAL LOW (ref 9–67)

## 2023-11-06 LAB — TSH: TSH: 1.4 m[IU]/L (ref 0.40–4.50)

## 2023-11-12 ENCOUNTER — Telehealth: Payer: Self-pay | Admitting: Neurology

## 2023-11-12 NOTE — Telephone Encounter (Signed)
Patient is returning a call to the nurse  he left message on the VM

## 2023-11-13 NOTE — Telephone Encounter (Signed)
See lab encounter. I have left a message about lab results per [facility]

## 2023-11-13 NOTE — Telephone Encounter (Signed)
Left message to call office 11/13/2023 at 8:43am

## 2023-12-25 ENCOUNTER — Other Ambulatory Visit: Payer: Self-pay

## 2023-12-25 DIAGNOSIS — E119 Type 2 diabetes mellitus without complications: Secondary | ICD-10-CM

## 2024-01-08 ENCOUNTER — Other Ambulatory Visit: Payer: 59

## 2024-01-11 ENCOUNTER — Other Ambulatory Visit

## 2024-01-12 ENCOUNTER — Other Ambulatory Visit: Payer: Self-pay | Admitting: Family Medicine

## 2024-01-12 DIAGNOSIS — E7800 Pure hypercholesterolemia, unspecified: Secondary | ICD-10-CM

## 2024-01-12 LAB — LIPID PANEL
Cholesterol: 110 mg/dL (ref <200)
HDL: 47 mg/dL (ref >=40)
LDL Cholesterol (Calc): 36 mg/dL
Non-HDL Cholesterol (Calc): 63 mg/dL (ref <130)
Total CHOL/HDL Ratio: 2.3 (calc) (ref <5.0)
Triglycerides: 198 mg/dL — ABNORMAL HIGH (ref <150)

## 2024-01-12 LAB — COMPLETE METABOLIC PANEL WITH GFR
AG Ratio: 2 (calc) (ref 1.0–2.5)
ALT: 39 U/L (ref 9–46)
AST: 23 U/L (ref 10–35)
Albumin: 4.3 g/dL (ref 3.6–5.1)
Alkaline phosphatase (APISO): 111 U/L (ref 35–144)
BUN: 15 mg/dL (ref 7–25)
CO2: 27 mmol/L (ref 20–32)
Calcium: 8.9 mg/dL (ref 8.6–10.3)
Chloride: 101 mmol/L (ref 98–110)
Creat: 1.05 mg/dL (ref 0.70–1.30)
Globulin: 2.2 g/dL (ref 1.9–3.7)
Glucose, Bld: 152 mg/dL — ABNORMAL HIGH (ref 65–99)
Potassium: 4.7 mmol/L (ref 3.5–5.3)
Sodium: 136 mmol/L (ref 135–146)
Total Bilirubin: 0.7 mg/dL (ref 0.2–1.2)
Total Protein: 6.5 g/dL (ref 6.1–8.1)

## 2024-01-12 LAB — HEMOGLOBIN A1C
Hgb A1c MFr Bld: 6.3 %{Hb} — ABNORMAL HIGH (ref <5.7)
Mean Plasma Glucose: 134 mg/dL
eAG (mmol/L): 7.4 mmol/L

## 2024-01-12 LAB — MICROALBUMIN / CREATININE URINE RATIO
Creatinine, Urine: 121 mg/dL (ref 20–320)
Microalb Creat Ratio: 3 mg/g{creat} (ref <30)
Microalb, Ur: 0.4 mg/dL

## 2024-01-12 LAB — C-PEPTIDE: C-Peptide: 3.39 ng/mL (ref 0.80–3.85)

## 2024-01-13 ENCOUNTER — Encounter: Payer: Self-pay | Admitting: "Endocrinology

## 2024-01-13 ENCOUNTER — Ambulatory Visit: Payer: 59 | Admitting: "Endocrinology

## 2024-01-13 VITALS — BP 122/80 | HR 84 | Ht 64.0 in | Wt 160.0 lb

## 2024-01-13 DIAGNOSIS — E1165 Type 2 diabetes mellitus with hyperglycemia: Secondary | ICD-10-CM

## 2024-01-13 DIAGNOSIS — Z7985 Long-term (current) use of injectable non-insulin antidiabetic drugs: Secondary | ICD-10-CM

## 2024-01-13 DIAGNOSIS — E7800 Pure hypercholesterolemia, unspecified: Secondary | ICD-10-CM

## 2024-01-13 DIAGNOSIS — Z7984 Long term (current) use of oral hypoglycemic drugs: Secondary | ICD-10-CM

## 2024-01-13 MED ORDER — EMPAGLIFLOZIN 10 MG PO TABS: 10.0000 mg | ORAL_TABLET | Freq: Every day | ORAL | 1 refills | Status: DC

## 2024-01-13 NOTE — Patient Instructions (Signed)
 Risks and side effects of SGLT-2 inhibitors including but not limited to yeast infections, urinary tract infections, dehydration, hyperkalemia, euglycemic diabetic ketoacidosis etc.    Goals of DM therapy:  Morning Fasting blood sugar: 80-140  Blood sugar before meals: 80-140 Bed time blood sugar: 100-150  A1C <7%, limited only by hypoglycemia  1.Diabetes medications and their side effects discussed, including hypoglycemia    2. Check blood glucose:  a) Always check blood sugars before driving. Please see below (under hypoglycemia) on how to manage b) Check a minimum of 3 times/day or more as needed when having symptoms of hypoglycemia.   c) Try to check blood glucose before sleeping/in the middle of the night to ensure that it is remaining stable and not dropping less than 100 d) Check blood glucose more often if sick  3. Diet: a) 3 meals per day schedule b: Restrict carbs to 60-70 grams (4 servings) per meal c) Colorful vegetables - 3 servings a day, and low sugar fruit 2 servings/day Plate control method: 1/4 plate protein, 1/4 starch, 1/2 green, yellow, or red vegetables d) Avoid carbohydrate snacks unless hypoglycemic episode, or increased physical activity  4. Regular exercise as tolerated, preferably 3 or more hours a week  5. Hypoglycemia: a)  Do not drive or operate machinery without first testing blood glucose to assure it is over 90 mg%, or if dizzy, lightheaded, not feeling normal, etc, or  if foot or leg is numb or weak. b)  If blood glucose less than 70, take four 5gm Glucose tabs or 15-30 gm Glucose gel.  Repeat every 15 min as needed until blood sugar is >100 mg/dl. If hypoglycemia persists then call 911.   6. Sick day management: a) Check blood glucose more often b) Continue usual therapy if blood sugars are elevated.   7. Contact the doctor immediately if blood glucose is frequently <60 mg/dl, or an episode of severe hypoglycemia occurs (where someone had to  give you glucose/  glucagon or if you passed out from a low blood glucose), or if blood glucose is persistently >350 mg/dl, for further management  8. A change in level of physical activity or exercise and a change in diet may also affect your blood sugar. Check blood sugars more often and call if needed.  Instructions: 1. Bring glucose meter, blood glucose records on every visit for review 2. Continue to follow up with primary care physician and other providers for medical care 3. Yearly eye  and foot exam 4. Please get blood work done prior to the next appointment

## 2024-01-13 NOTE — Progress Notes (Signed)
 Outpatient Endocrinology Note Joseph Industry, MD  01/13/24   Joseph Horton [birth year] [id number]  Referring Provider: Gerre Scull, NP Primary Care Provider: Mliss Sax, MD Reason for consultation: Subjective   Assessment & Plan  Diagnoses and all orders for this visit:  Uncontrolled type 2 diabetes mellitus with hyperglycemia (HCC)  Long term (current) use of oral hypoglycemic drugs  Long-term (current) use of injectable non-insulin antidiabetic drugs  Pure hypercholesterolemia  Other orders -     empagliflozin (JARDIANCE) 10 MG TABS tablet; Take 1 tablet (10 mg total) by mouth daily before breakfast.    Diabetes Type II complicated by MI s/p stents   Lab Results  Component Value Date   GFR 75.66 09/14/2023   Hba1c goal less than 7, current Hba1c is  Lab Results  Component Value Date   HGBA1C 6.3 (H) 01/11/2024   Will recommend the following: Ozempic 1 mg/10 days - increase gradually to 1 mg/week Metformin XR 500 mg 1 pill bid - increase gradually to 2 pills bid  Start jardiance 10 gm every day given history of MI and stents - patient has cardiologist  Walks after meals  No known contraindications/side effects to any of above medications Risks and side effects of SGLT-2 inhibitors including but not limited to yeast infections, urinary tract infections, dehydration, hyperkalemia, euglycemic DKA etc.   We will start you on a medication called jardiance. This medicine is once daily and can have side effects of increased urination, dizziness on standing and urinary tract infections. Please be very careful with hygiene after you urinate to help prevent this.  If you have any worsening abdominal pain or fatigue or urinary infection while on this medication, stop it and let us know.  -Last LD and Tg are as follows: Lab Results  Component Value Date   LDLCALC 36 01/11/2024    Lab Results  Component Value Date   TRIG 198 (H) 01/11/2024    -couldn't do statin due to joint pains and high liver enzymes, now on zetia 10 mg every day and repatha 140 mg every 2 weeks -patient will discuss with his cardiologist the need for zetia while he is on repatha  -Follow low fat diet and exercise   -Blood pressure goal <140/90 - Microalbumin/creatinine goal is < 30 -Last MA/Cr is as follows: Lab Results  Component Value Date   MICROALBUR 0.4 01/11/2024   -on ACE/ARB lisinopril 10 mg every day  -diet changes including salt restriction -limit eating outside -counseled BP targets per standards of diabetes care -uncontrolled blood pressure can lead to retinopathy, nephropathy and cardiovascular and atherosclerotic heart disease  Reviewed and counseled on: -A1C target -Blood sugar targets -Complications of uncontrolled diabetes  -Checking blood sugar before meals and bedtime and bring log next visit -All medications with mechanism of action and side effects -Hypoglycemia management: rule of 15's, Glucagon Emergency Kit and medical alert ID -low-carb low-fat plate-method diet -At least 20 minutes of physical activity per day -Annual dilated retinal eye exam and foot exam -compliance and follow up needs -follow up as scheduled or earlier if problem gets worse  Call if blood sugar is less than 70 or consistently above 250    Take a 15 gm snack of carbohydrate at bedtime before you go to sleep if your blood sugar is less than 100.    If you are going to fast after midnight for a test or procedure, ask your physician for instructions on how to reduce/decrease your insulin  dose.    Call if blood sugar is less than 70 or consistently above 250  -Treating a low sugar by rule of 15  (15 gms of sugar every 15 min until sugar is more than 70) If you feel your sugar is low, test your sugar to be sure If your sugar is low (less than 70), then take 15 grams of a fast acting Carbohydrate (3-4 glucose tablets or glucose gel or 4 ounces of juice  or regular soda) Recheck your sugar 15 min after treating low to make sure it is more than 70 If sugar is still less than 70, treat again with 15 grams of carbohydrate          Don't drive the hour of hypoglycemia  If unconscious/unable to eat or drink by mouth, use glucagon injection or nasal spray baqsimi and call 911. Can repeat again in 15 min if still unconscious.  No follow-ups on file.   I have reviewed current medications, nurse's notes, allergies, vital signs, past medical and surgical history, family medical history, and social history for this encounter. Counseled patient on symptoms, examination findings, lab findings, imaging results, treatment decisions and monitoring and prognosis. The patient understood the recommendations and agrees with the treatment plan. All questions regarding treatment plan were fully answered.  Joseph , MD  01/13/24    History of Present Illness Joseph Horton is a 53 y.o. year old male who presents for evaluation of Type II diabetes mellitus.  Joseph Horton was first diagnosed in 2006.   Diabetes education +  Home diabetes regimen: Ozempic 1 mg/10 days  Metformin XR 500 mg 1 pill bid   COMPLICATIONS +  MI/Stroke -  retinopathy -  neuropathy -  nephropathy  SYMPTOMS REVIEWED - Polyuria - Weight loss  - Blurred vision  BLOOD SUGAR DATA  CGM interpretation: At today's visit, we reviewed her CGM downloads. The full report is scanned in the media. Reviewing the CGM trends, BG are elevated after lunch and supper.    Physical Exam  BP 122/80   Pulse 84   Ht 5\' 4"  (1.626 m)   Wt 160 lb (72.6 kg)   SpO2 98%   BMI 27.46 kg/m    Constitutional: well developed, well nourished Head: normocephalic, atraumatic Eyes: sclera anicteric, no redness Neck: supple Lungs: normal respiratory effort Neurology: alert and oriented Skin: dry, no appreciable rashes Musculoskeletal: no appreciable defects Psychiatric: normal mood and  affect Diabetic Foot Exam - Simple   No data filed      Current Medications Patient's Medications  New Prescriptions   EMPAGLIFLOZIN (JARDIANCE) 10 MG TABS TABLET    Take 1 tablet (10 mg total) by mouth daily before breakfast.  Previous Medications   ASPIRIN 81 MG TABLET    Take 1 tablet by mouth daily.   B COMPLEX VITAMINS CAPSULE    Take 1 capsule by mouth daily.   CONTINUOUS BLOOD GLUC RECEIVER (DEXCOM G7 RECEIVER) DEVI    1 Units by Does not apply route daily.   CONTINUOUS BLOOD GLUC SENSOR (DEXCOM G7 SENSOR) MISC    CHANGE SENSOR AFTER NO MORE THAN 10 DAYS   EVOLOCUMAB (REPATHA SURECLICK) 140 MG/ML SOAJ    Inject 280 mg into the skin every 14 (fourteen) days. INJECT 280 MG SUBCUTANEOUSLY EVERY 14 DAYS APPOINTMENT REQUIRED FOR FUTURE REFILLS   EZETIMIBE (ZETIA) 10 MG TABLET    Take 1 tablet (10 mg total) by mouth daily.   LISINOPRIL (PRINIVIL,ZESTRIL) 10 MG TABLET  Take 10 mg by mouth daily.   METFORMIN (GLUCOPHAGE-XR) 500 MG 24 HR TABLET    Take 1 tablet with morning meal and then 2 tablets with evening meal   METOPROLOL SUCCINATE (TOPROL-XL) 25 MG 24 HR TABLET    Take 1 tablet by mouth daily.   NITROGLYCERIN (NITROSTAT) 0.4 MG SL TABLET    Place 1 tablet under the tongue every 5 minutes as needed for up to 30 days for chest pain.   OMEGA-3 1000 MG CAPS    Take 1 capsule by mouth daily.   SEMAGLUTIDE, 1 MG/DOSE, 4 MG/3ML SOPN    Inject 1 mg as directed once a week.  Modified Medications   No medications on file  Discontinued Medications   No medications on file    Allergies Allergies  Allergen Reactions   Crab [Shellfish Allergy]     Blue crab   Rosuvastatin Other (See Comments)    myalgia myalgia    Statins Other (See Comments)    Past Medical History Past Medical History:  Diagnosis Date   History of colon polyps    Hyperlipidemia    Myocardial infarction (HCC)    Neuromuscular disorder (HCC)    Bell's palsy    Past Surgical History Past Surgical History:   Procedure Laterality Date   CARDIAC CATHETERIZATION  05/05/2014   stents placed   COLONOSCOPY     2018    Family History family history includes Asthma in his mother; Cancer in his father; Colon polyps in his mother; Heart disease in his father; Hyperlipidemia in his mother.  Social History Social History   Socioeconomic History   Marital status: Significant Other    Spouse name: Not on file   Number of children: Not on file   Years of education: Not on file   Highest education level: Not on file  Occupational History   Not on file  Tobacco Use   Smoking status: Never   Smokeless tobacco: Never  Vaping Use   Vaping status: Never Used  Substance and Sexual Activity   Alcohol use: Yes    Comment: occ 2 glasses of wine   Drug use: Never   Sexual activity: Not on file  Other Topics Concern   Not on file  Social History Narrative   Are you right handed or left handed? Right Handed    Are you currently employed ? 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 Finance   What type of home do you live in: 1 story or 2 story? Lives in a one story home       Social Drivers of Health   Financial Resource Strain: Not on file  Food Insecurity: Low Risk  (09/08/2023)   Received from [facility]   Hunger Vital Sign    Worried About Running Out of Food in the Last Year: Never true    Ran Out of Food in the Last Year: Never true  Transportation Needs: No Transportation Needs (09/08/2023)   Received from [facility]    In the past 12 months, has lack of reliable transportation kept you from medical appointments, meetings, work or from getting things needed for daily living? : No  Physical Activity: Not on file  Stress: Not on file  Social Connections: Unknown (02/28/2022)   Received from [facility], [facility]   Social Network    Social Network: Not  on file  Intimate Partner Violence: Unknown  (01/21/2022)   Received from [facility]   HITS    Physically Hurt: Not on file    Insult or Talk Down To: Not on file    Threaten Physical Harm: Not on file    Scream or Curse: Not on file    Lab Results  Component Value Date   HGBA1C 6.3 (H) 01/11/2024   HGBA1C 8.3 (H) 09/14/2023   HGBA1C 6.8 (H) 05/22/2022   Lab Results  Component Value Date   CHOL 110 01/11/2024   Lab Results  Component Value Date   HDL 47 01/11/2024   Lab Results  Component Value Date   LDLCALC 36 01/11/2024   Lab Results  Component Value Date   TRIG 198 (H) 01/11/2024   Lab Results  Component Value Date   CHOLHDL 2.3 01/11/2024   Lab Results  Component Value Date   CREATININE 1.05 01/11/2024   Lab Results  Component Value Date   GFR 75.66 09/14/2023   Lab Results  Component Value Date   MICROALBUR 0.4 01/11/2024      Component Value Date/Time   NA 136 01/11/2024 0811   K 4.7 01/11/2024 0811   CL 101 01/11/2024 0811   CO2 27 01/11/2024 0811   GLUCOSE 152 (H) 01/11/2024 0811   BUN 15 01/11/2024 0811   CREATININE 1.05 01/11/2024 0811   CALCIUM 8.9 01/11/2024 0811   PROT 6.5 01/11/2024 0811   ALBUMIN 4.2 09/14/2023 1622   AST 23 01/11/2024 0811   ALT 39 01/11/2024 0811   ALKPHOS 88 09/14/2023 1622   BILITOT 0.7 01/11/2024 0811      Latest Ref Rng & Units 01/11/2024    8:11 AM 09/14/2023    4:22 PM 09/03/2023    8:51 AM  BMP  Glucose 65 - 99 mg/dL 161  096  045   BUN 7 - 25 mg/dL 15  23  11    Creatinine 0.70 - 1.30 mg/dL 4.09  8.11  9.14   BUN/Creat Ratio 6 - 22 (calc) SEE NOTE:     Sodium 135 - 146 mmol/L 136  131  137   Potassium 3.5 - 5.3 mmol/L 4.7  4.9  4.3   Chloride 98 - 110 mmol/L 101  100  104   CO2 20 - 32 mmol/L 27  22  26    Calcium 8.6 - 10.3 mg/dL 8.9  8.8  9.2        Component Value Date/Time   WBC 8.6 09/14/2023 1622   RBC 4.92 09/14/2023 1622   HGB 16.6 09/14/2023 1622   HCT 47.9 09/14/2023 1622   PLT 262.0 09/14/2023 1622   MCV 97.5  09/14/2023 1622   MCHC 34.6 09/14/2023 1622   RDW 12.7 09/14/2023 1622   LYMPHSABS 0.9 09/14/2023 1622   MONOABS 0.2 09/14/2023 1622   EOSABS 0.0 09/14/2023 1622   BASOSABS 0.0 09/14/2023 1622     Parts of this note may have been dictated using voice recognition software. There may be variances in spelling and vocabulary which are unintentional. Not all errors are proofread. Please notify the Thereasa Parkin if any discrepancies are noted or if the meaning of any statement is not clear.

## 2024-01-15 ENCOUNTER — Encounter: Payer: Self-pay | Admitting: "Endocrinology

## 2024-01-26 ENCOUNTER — Other Ambulatory Visit: Payer: Self-pay | Admitting: Family Medicine

## 2024-01-26 DIAGNOSIS — E119 Type 2 diabetes mellitus without complications: Secondary | ICD-10-CM

## 2024-04-14 ENCOUNTER — Encounter: Payer: Self-pay | Admitting: "Endocrinology

## 2024-04-14 ENCOUNTER — Telehealth: Payer: Self-pay

## 2024-04-14 ENCOUNTER — Ambulatory Visit: Admitting: "Endocrinology

## 2024-04-14 ENCOUNTER — Other Ambulatory Visit (HOSPITAL_COMMUNITY): Payer: Self-pay

## 2024-04-14 VITALS — BP 122/80 | HR 68 | Ht 64.0 in | Wt 158.0 lb

## 2024-04-14 DIAGNOSIS — Z794 Long term (current) use of insulin: Secondary | ICD-10-CM | POA: Diagnosis not present

## 2024-04-14 DIAGNOSIS — Z7984 Long term (current) use of oral hypoglycemic drugs: Secondary | ICD-10-CM | POA: Diagnosis not present

## 2024-04-14 DIAGNOSIS — E7800 Pure hypercholesterolemia, unspecified: Secondary | ICD-10-CM | POA: Diagnosis not present

## 2024-04-14 DIAGNOSIS — Z7985 Long-term (current) use of injectable non-insulin antidiabetic drugs: Secondary | ICD-10-CM | POA: Diagnosis not present

## 2024-04-14 DIAGNOSIS — E1159 Type 2 diabetes mellitus with other circulatory complications: Secondary | ICD-10-CM | POA: Diagnosis not present

## 2024-04-14 DIAGNOSIS — E1165 Type 2 diabetes mellitus with hyperglycemia: Secondary | ICD-10-CM

## 2024-04-14 LAB — POCT GLYCOSYLATED HEMOGLOBIN (HGB A1C): Hemoglobin A1C: 6.3 % — AB (ref 4.0–5.6)

## 2024-04-14 NOTE — Telephone Encounter (Signed)
 Pharmacy Patient Advocate Encounter   Received notification from Pt Calls Messages that prior authorization for Jardiance  10mg  is required/requested.   Insurance verification completed.   The patient is insured through [employer] .   Per test claim: PA required; PA started via CoverMyMeds. KEY [id number] . Waiting for clinical questions to populate.

## 2024-04-14 NOTE — Telephone Encounter (Signed)
 Pt needs PA done for Jardiance . Thanks.

## 2024-04-14 NOTE — Progress Notes (Signed)
 Outpatient Endocrinology Note Joseph Birmingham, MD  04/14/24   Joseph Horton 15-Jun-1971 [id number]  Referring Provider: Berneta Elsie Sayre,* Primary Care Provider: Berneta Elsie Sayre, MD Reason for consultation: Subjective   Assessment & Plan  Diagnoses and all orders for this visit:  Controlled type 2 diabetes mellitus with other circulatory complication, with long-term current use of insulin (HCC) -     POCT glycosylated hemoglobin (Hb A1C)  Long term (current) use of oral hypoglycemic drugs  Long-term (current) use of injectable non-insulin antidiabetic drugs  Pure hypercholesterolemia    Diabetes Type II complicated by MI s/p stents   Lab Results  Component Value Date   GFR 75.66 09/14/2023   Hba1c goal less than 7, current Hba1c is  Lab Results  Component Value Date   HGBA1C 6.3 (A) 04/14/2024   Will recommend the following: Ozempic  1 mg/10 days - increase gradually to 1 mg/week Metformin  XR 500 mg 2 pils qam and 1 pill qpm - increase gradually to 2 pills bid  Jardiance  10 mg every day given history of MI and stents - patient has cardiologist, took fro 1 mo now facing insurance issues so not on it now  Walk after meals  No known contraindications/side effects to any of above medications Risks and side effects of SGLT-2 inhibitors including but not limited to yeast infections, urinary tract infections, dehydration, hyperkalemia, euglycemic DKA etc.   We will start you on a medication called jardiance . This medicine is once daily and can have side effects of increased urination, dizziness on standing and urinary tract infections. Please be very careful with hygiene after you urinate to help prevent this.  If you have any worsening abdominal pain or fatigue or urinary infection while on this medication, stop it and let us  know.  -Last LD and Tg are as follows: Lab Results  Component Value Date   LDLCALC 36 01/11/2024    Lab Results  Component Value  Date   TRIG 198 (H) 01/11/2024   -couldn't do statin due to joint pains and high liver enzymes, now on zetia  10 mg every day and repatha  140 mg every 2 weeks, omega-3 1000 mg 1 cap a day -patient will discuss with his cardiologist the need for zetia  while he is on repatha   -Follow low fat diet and exercise   -Blood pressure goal <140/90 - Microalbumin/creatinine goal is < 30 -Last MA/Cr is as follows: Lab Results  Component Value Date   MICROALBUR 0.4 01/11/2024   -on ACE/ARB lisinopril 10 mg every day  -diet changes including salt restriction -limit eating outside -counseled BP targets per standards of diabetes care -uncontrolled blood pressure can lead to retinopathy, nephropathy and cardiovascular and atherosclerotic heart disease  Reviewed and counseled on: -A1C target -Blood sugar targets -Complications of uncontrolled diabetes  -Checking blood sugar before meals and bedtime and bring log next visit -All medications with mechanism of action and side effects -Hypoglycemia management: rule of 15's, Glucagon Emergency Kit and medical alert ID -low-carb low-fat plate-method diet -At least 20 minutes of physical activity per day -Annual dilated retinal eye exam and foot exam -compliance and follow up needs -follow up as scheduled or earlier if problem gets worse  Call if blood sugar is less than 70 or consistently above 250    Take a 15 gm snack of carbohydrate at bedtime before you go to sleep if your blood sugar is less than 100.    If you are going to fast after midnight for  a test or procedure, ask your physician for instructions on how to reduce/decrease your insulin dose.    Call if blood sugar is less than 70 or consistently above 250  -Treating a low sugar by rule of 15  (15 gms of sugar every 15 min until sugar is more than 70) If you feel your sugar is low, test your sugar to be sure If your sugar is low (less than 70), then take 15 grams of a fast acting  Carbohydrate (3-4 glucose tablets or glucose gel or 4 ounces of juice or regular soda) Recheck your sugar 15 min after treating low to make sure it is more than 70 If sugar is still less than 70, treat again with 15 grams of carbohydrate          Don't drive the hour of hypoglycemia  If unconscious/unable to eat or drink by mouth, use glucagon injection or nasal spray baqsimi and call 911. Can repeat again in 15 min if still unconscious.  Return in about 4 months (around 08/14/2024).   I have reviewed current medications, nurse's notes, allergies, vital signs, past medical and surgical history, family medical history, and social history for this encounter. Counseled patient on symptoms, examination findings, lab findings, imaging results, treatment decisions and monitoring and prognosis. The patient understood the recommendations and agrees with the treatment plan. All questions regarding treatment plan were fully answered.  Joseph Birmingham, MD  04/14/24    History of Present Illness Joseph Horton is a 53 y.o. year old male who presents for evaluation of Type II diabetes mellitus.  Deep Bonawitz was first diagnosed in 2006.   Diabetes education +  Home diabetes regimen: Ozempic  1 mg/10 days - increase gradually to 1 mg/week Metformin  XR 500 mg 2 pils qama dn 1 pill qpm - increase gradually to 2 pills bid  Jardiance  10 mg every day given history of MI and stents - patient has cardiologist, took fro 1 mo now facing insurance issues so not on it now   COMPLICATIONS +  MI/Stroke -  retinopathy -  neuropathy -  nephropathy  SYMPTOMS REVIEWED - Polyuria - Weight loss  - Blurred vision  BLOOD SUGAR DATA CGM interpretation: At today's visit, we reviewed her CGM downloads. The full report is scanned in the media. Reviewing the CGM trends, BG are well controlled most of the day, with some elevations with meals.  Physical Exam  BP 122/80   Pulse 68   Ht 5' 4 (1.626 m)   Wt 158 lb  (71.7 kg)   SpO2 98%   BMI 27.12 kg/m    Constitutional: well developed, well nourished Head: normocephalic, atraumatic Eyes: sclera anicteric, no redness Neck: supple Lungs: normal respiratory effort Neurology: alert and oriented Skin: dry, no appreciable rashes Musculoskeletal: no appreciable defects Psychiatric: normal mood and affect Diabetic Foot Exam - Simple   No data filed      Current Medications Patient's Medications  New Prescriptions   No medications on file  Previous Medications   ASPIRIN 81 MG TABLET    Take 1 tablet by mouth daily.   B COMPLEX VITAMINS CAPSULE    Take 1 capsule by mouth daily.   CONTINUOUS BLOOD GLUC RECEIVER (DEXCOM G7 RECEIVER) DEVI    1 Units by Does not apply route daily.   CONTINUOUS GLUCOSE SENSOR (DEXCOM G7 SENSOR) MISC    CHANGE SENSOR AFTER NO MORE THAN 10 DAYS   EMPAGLIFLOZIN  (JARDIANCE ) 10 MG TABS TABLET    Take  1 tablet (10 mg total) by mouth daily before breakfast.   EVOLOCUMAB  (REPATHA  SURECLICK) 140 MG/ML SOAJ    Inject 280 mg into the skin every 14 (fourteen) days. INJECT 280 MG SUBCUTANEOUSLY EVERY 14 DAYS APPOINTMENT REQUIRED FOR FUTURE REFILLS   EZETIMIBE  (ZETIA ) 10 MG TABLET    Take 1 tablet (10 mg total) by mouth daily.   LISINOPRIL (PRINIVIL,ZESTRIL) 10 MG TABLET    Take 10 mg by mouth daily.   METFORMIN  (GLUCOPHAGE -XR) 500 MG 24 HR TABLET    Take 1 tablet with morning meal and then 2 tablets with evening meal   METOPROLOL SUCCINATE (TOPROL-XL) 25 MG 24 HR TABLET    Take 1 tablet by mouth daily.   NITROGLYCERIN (NITROSTAT) 0.4 MG SL TABLET    Place 1 tablet under the tongue every 5 minutes as needed for up to 30 days for chest pain.   OMEGA-3 1000 MG CAPS    Take 1 capsule by mouth daily.   SEMAGLUTIDE , 1 MG/DOSE, 4 MG/3ML SOPN    Inject 1 mg as directed once a week.  Modified Medications   No medications on file  Discontinued Medications   No medications on file    Allergies Allergies  Allergen Reactions   Crab  [Shellfish Allergy]     Blue crab   Rosuvastatin Other (See Comments)    myalgia myalgia    Statins Other (See Comments)    Past Medical History Past Medical History:  Diagnosis Date   History of colon polyps    Hyperlipidemia    Myocardial infarction (HCC)    Neuromuscular disorder (HCC)    Bell's palsy    Past Surgical History Past Surgical History:  Procedure Laterality Date   CARDIAC CATHETERIZATION  05/05/2014   stents placed   COLONOSCOPY     2018    Family History family history includes Asthma in his mother; Cancer in his father; Colon polyps in his mother; Heart disease in his father; Hyperlipidemia in his mother.  Social History Social History   Socioeconomic History   Marital status: Significant Other    Spouse name: Not on file   Number of children: Not on file   Years of education: Not on file   Highest education level: Not on file  Occupational History   Not on file  Tobacco Use   Smoking status: Never   Smokeless tobacco: Never  Vaping Use   Vaping status: Never Used  Substance and Sexual Activity   Alcohol use: Yes    Comment: occ 2 glasses of wine   Drug use: Never   Sexual activity: Not on file  Other Topics Concern   Not on file  Social History Narrative   Are you right handed or left handed? Right Handed    Are you currently employed ? 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 Finance   What type of home do you live in: 1 story or 2 story? Lives in a one story home       Social Drivers of Health   Financial Resource Strain: Not on file  Food Insecurity: Low Risk  (09/08/2023)   Received from [facility]   Hunger Vital Sign    Within the past 12 months, you worried that your food would run out before you got money to buy more: Never true    Within the past 12 months, the food you  bought just didn't last and you didn't have money to get more. : Never true   Transportation Needs: No Transportation Needs (09/08/2023)   Received from [facility]    In the past 12 months, has lack of reliable transportation kept you from medical appointments, meetings, work or from getting things needed for daily living? : No  Physical Activity: Not on file  Stress: Not on file  Social Connections: Unknown (02/28/2022)   Received from [facility]   Social Network    Social Network: Not on file  Intimate Partner Violence: Unknown (01/21/2022)   Received from [facility]   HITS    Physically Hurt: Not on file    Insult or Talk Down To: Not on file    Threaten Physical Harm: Not on file    Scream or Curse: Not on file    Lab Results  Component Value Date   HGBA1C 6.3 (A) 04/14/2024   HGBA1C 6.3 (H) 01/11/2024   HGBA1C 8.3 (H) 09/14/2023   Lab Results  Component Value Date   CHOL 110 01/11/2024   Lab Results  Component Value Date   HDL 47 01/11/2024   Lab Results  Component Value Date   LDLCALC 36 01/11/2024   Lab Results  Component Value Date   TRIG 198 (H) 01/11/2024   Lab Results  Component Value Date   CHOLHDL 2.3 01/11/2024   Lab Results  Component Value Date   CREATININE 1.05 01/11/2024   Lab Results  Component Value Date   GFR 75.66 09/14/2023   Lab Results  Component Value Date   MICROALBUR 0.4 01/11/2024      Component Value Date/Time   NA 136 01/11/2024 0811   K 4.7 01/11/2024 0811   CL 101 01/11/2024 0811   CO2 27 01/11/2024 0811   GLUCOSE 152 (H) 01/11/2024 0811   BUN 15 01/11/2024 0811   CREATININE 1.05 01/11/2024 0811   CALCIUM 8.9 01/11/2024 0811   PROT 6.5 01/11/2024 0811   ALBUMIN 4.2 09/14/2023 1622   AST 23 01/11/2024 0811   ALT 39 01/11/2024 0811   ALKPHOS 88 09/14/2023 1622   BILITOT 0.7 01/11/2024 0811      Latest Ref Rng & Units 01/11/2024    8:11 AM 09/14/2023    4:22 PM 09/03/2023    8:51 AM  BMP  Glucose 65 - 99 mg/dL 847  687  884   BUN 7 - 25 mg/dL 15  23  11     Creatinine 0.70 - 1.30 mg/dL 8.94  8.88  8.91   BUN/Creat Ratio 6 - 22 (calc) SEE NOTE:     Sodium 135 - 146 mmol/L 136  131  137   Potassium 3.5 - 5.3 mmol/L 4.7  4.9  4.3   Chloride 98 - 110 mmol/L 101  100  104   CO2 20 - 32 mmol/L 27  22  26    Calcium 8.6 - 10.3 mg/dL 8.9  8.8  9.2        Component Value Date/Time   WBC 8.6 09/14/2023 1622   RBC 4.92 09/14/2023 1622   HGB 16.6 09/14/2023 1622   HCT 47.9 09/14/2023 1622   PLT 262.0 09/14/2023 1622   MCV 97.5 09/14/2023 1622   MCHC 34.6 09/14/2023 1622   RDW 12.7 09/14/2023 1622   LYMPHSABS 0.9 09/14/2023 1622   MONOABS 0.2 09/14/2023 1622   EOSABS 0.0 09/14/2023 1622   BASOSABS 0.0 09/14/2023 1622     Parts of this  note may have been dictated using voice recognition software. There may be variances in spelling and vocabulary which are unintentional. Not all errors are proofread. Please notify the dino if any discrepancies are noted or if the meaning of any statement is not clear.

## 2024-04-18 NOTE — Telephone Encounter (Signed)
 Pharmacy Patient Advocate Encounter  Received notification from [employer] that Prior Authorization for Jardiance  10mg  has been CANCELLED due to PA not Required. Medication is Covered   PA #/Case ID/Reference #: [id number]

## 2024-04-30 ENCOUNTER — Other Ambulatory Visit: Payer: Self-pay | Admitting: Family Medicine

## 2024-05-02 ENCOUNTER — Other Ambulatory Visit: Payer: Self-pay | Admitting: Family Medicine

## 2024-05-02 DIAGNOSIS — E1165 Type 2 diabetes mellitus with hyperglycemia: Secondary | ICD-10-CM

## 2024-05-02 MED ORDER — EMPAGLIFLOZIN 10 MG PO TABS: 10.0000 mg | ORAL_TABLET | Freq: Every day | ORAL | 1 refills | Status: DC

## 2024-05-02 NOTE — Addendum Note (Signed)
 Addended by: ARLOA JEOFFREY SAILOR on: 05/02/2024 08:46 AM   Modules accepted: Orders

## 2024-06-22 ENCOUNTER — Encounter: Payer: Self-pay | Admitting: "Endocrinology

## 2024-06-22 DIAGNOSIS — Z794 Long term (current) use of insulin: Secondary | ICD-10-CM

## 2024-06-23 MED ORDER — OZEMPIC (1 MG/DOSE) 4 MG/3ML ~~LOC~~ SOPN: 1.0000 mg | PEN_INJECTOR | SUBCUTANEOUS | 0 refills | Status: DC

## 2024-07-27 ENCOUNTER — Other Ambulatory Visit (HOSPITAL_COMMUNITY): Payer: Self-pay

## 2024-07-27 ENCOUNTER — Telehealth: Payer: Self-pay | Admitting: Pharmacy Technician

## 2024-07-27 NOTE — Telephone Encounter (Signed)
 Pharmacy Patient Advocate Encounter   Received notification from [employer] that prior authorization for Ozempic  (1 MG/DOSE) 4MG /3ML pen-injectors  is due for renewal.   Insurance verification completed.   The patient is insured through [employer].  Action: PA required; PA started via CoverMyMeds. KEY [id number] . Waiting for clinical questions to populate.

## 2024-07-27 NOTE — Telephone Encounter (Signed)
 Pharmacy Patient Advocate Encounter  Received notification from [employer] that Prior Authorization for Ozempic  (1 MG/DOSE) 4MG /3ML pen-injectors  has been CANCELLED due to PA not needed yet. Previous one expires 08/25/24   PA #/Case ID/Reference #: [id number]

## 2024-10-18 ENCOUNTER — Other Ambulatory Visit: Payer: Self-pay | Admitting: Family Medicine

## 2024-10-18 DIAGNOSIS — E1165 Type 2 diabetes mellitus with hyperglycemia: Secondary | ICD-10-CM

## 2024-10-31 ENCOUNTER — Other Ambulatory Visit: Payer: Self-pay | Admitting: "Endocrinology

## 2024-10-31 DIAGNOSIS — E1159 Type 2 diabetes mellitus with other circulatory complications: Secondary | ICD-10-CM

## 2024-11-01 ENCOUNTER — Other Ambulatory Visit: Payer: Self-pay | Admitting: "Endocrinology

## 2024-11-01 DIAGNOSIS — E1159 Type 2 diabetes mellitus with other circulatory complications: Secondary | ICD-10-CM

## 2024-11-09 ENCOUNTER — Other Ambulatory Visit: Payer: Self-pay | Admitting: "Endocrinology
